# Patient Record
Sex: Female | Born: 1935 | Race: Black or African American | Hispanic: No | Marital: Single
Health system: Southern US, Community
[De-identification: ages and names within clinical notes are randomized; demographics above are authoritative.]

## PROBLEM LIST (undated history)

## (undated) ENCOUNTER — Emergency Department (HOSPITAL_COMMUNITY): Payer: Self-pay | Source: Home / Self Care

## (undated) DIAGNOSIS — I469 Cardiac arrest, cause unspecified: Secondary | ICD-10-CM

## (undated) DIAGNOSIS — J9621 Acute and chronic respiratory failure with hypoxia: Secondary | ICD-10-CM

## (undated) DIAGNOSIS — G9349 Other encephalopathy: Secondary | ICD-10-CM

## (undated) DIAGNOSIS — E669 Obesity, unspecified: Secondary | ICD-10-CM

## (undated) DIAGNOSIS — J181 Lobar pneumonia, unspecified organism: Secondary | ICD-10-CM

## (undated) DIAGNOSIS — I482 Chronic atrial fibrillation, unspecified: Secondary | ICD-10-CM

## (undated) DIAGNOSIS — K219 Gastro-esophageal reflux disease without esophagitis: Secondary | ICD-10-CM

## (undated) DIAGNOSIS — E1169 Type 2 diabetes mellitus with other specified complication: Secondary | ICD-10-CM

## (undated) DIAGNOSIS — F09 Unspecified mental disorder due to known physiological condition: Secondary | ICD-10-CM

## (undated) DIAGNOSIS — R1310 Dysphagia, unspecified: Secondary | ICD-10-CM

## (undated) DIAGNOSIS — A0472 Enterocolitis due to Clostridium difficile, not specified as recurrent: Secondary | ICD-10-CM

## (undated) HISTORY — PX: TRACHEOSTOMY: SUR1362

## (undated) HISTORY — PX: PEG PLACEMENT: SHX5437

---

## 2018-01-14 ENCOUNTER — Inpatient Hospital Stay
Admission: RE | Admit: 2018-01-14 | Discharge: 2018-02-15 | Disposition: E | Payer: 59 | Source: Other Acute Inpatient Hospital | Attending: Internal Medicine | Admitting: Internal Medicine

## 2018-01-14 ENCOUNTER — Other Ambulatory Visit (HOSPITAL_COMMUNITY): Payer: 59

## 2018-01-14 DIAGNOSIS — J9690 Respiratory failure, unspecified, unspecified whether with hypoxia or hypercapnia: Secondary | ICD-10-CM

## 2018-01-14 DIAGNOSIS — F09 Unspecified mental disorder due to known physiological condition: Secondary | ICD-10-CM

## 2018-01-14 DIAGNOSIS — R52 Pain, unspecified: Secondary | ICD-10-CM

## 2018-01-14 DIAGNOSIS — I482 Chronic atrial fibrillation, unspecified: Secondary | ICD-10-CM

## 2018-01-14 DIAGNOSIS — J9621 Acute and chronic respiratory failure with hypoxia: Secondary | ICD-10-CM

## 2018-01-14 DIAGNOSIS — I469 Cardiac arrest, cause unspecified: Secondary | ICD-10-CM

## 2018-01-14 DIAGNOSIS — I509 Heart failure, unspecified: Secondary | ICD-10-CM

## 2018-01-14 DIAGNOSIS — J9600 Acute respiratory failure, unspecified whether with hypoxia or hypercapnia: Secondary | ICD-10-CM

## 2018-01-14 DIAGNOSIS — Z931 Gastrostomy status: Secondary | ICD-10-CM

## 2018-01-14 DIAGNOSIS — J181 Lobar pneumonia, unspecified organism: Secondary | ICD-10-CM

## 2018-01-14 DIAGNOSIS — G9349 Other encephalopathy: Secondary | ICD-10-CM

## 2018-01-14 HISTORY — DX: Other encephalopathy: G93.49

## 2018-01-14 HISTORY — DX: Acute and chronic respiratory failure with hypoxia: J96.21

## 2018-01-14 HISTORY — DX: Dysphagia, unspecified: R13.10

## 2018-01-14 HISTORY — DX: Enterocolitis due to Clostridium difficile, not specified as recurrent: A04.72

## 2018-01-14 HISTORY — DX: Type 2 diabetes mellitus with other specified complication: E11.69

## 2018-01-14 HISTORY — DX: Unspecified mental disorder due to known physiological condition: F09

## 2018-01-14 HISTORY — DX: Cardiac arrest, cause unspecified: I46.9

## 2018-01-14 HISTORY — DX: Lobar pneumonia, unspecified organism: J18.1

## 2018-01-14 HISTORY — DX: Gastro-esophageal reflux disease without esophagitis: K21.9

## 2018-01-14 HISTORY — DX: Type 2 diabetes mellitus with other specified complication: E66.9

## 2018-01-14 HISTORY — DX: Chronic atrial fibrillation, unspecified: I48.20

## 2018-01-14 LAB — BLOOD GAS, ARTERIAL
ACID-BASE EXCESS: 6.3 mmol/L — AB (ref 0.0–2.0)
BICARBONATE: 30.5 mmol/L — AB (ref 20.0–28.0)
FIO2: 28
LHR: 14 {breaths}/min
MECHVT: 400 mL
O2 SAT: 97.5 %
PATIENT TEMPERATURE: 98.6
PCO2 ART: 45.4 mmHg (ref 32.0–48.0)
PEEP/CPAP: 5 cmH2O
PH ART: 7.442 (ref 7.350–7.450)
PO2 ART: 99.2 mmHg (ref 83.0–108.0)

## 2018-01-14 MED ORDER — IOPAMIDOL (ISOVUE-300) INJECTION 61%
INTRAVENOUS | Status: AC
  Administered 2018-01-14: 23:00:00
  Filled 2018-01-14: qty 50

## 2018-01-15 ENCOUNTER — Encounter: Payer: Self-pay | Admitting: Internal Medicine

## 2018-01-15 DIAGNOSIS — J181 Lobar pneumonia, unspecified organism: Secondary | ICD-10-CM | POA: Diagnosis not present

## 2018-01-15 DIAGNOSIS — G9349 Other encephalopathy: Secondary | ICD-10-CM

## 2018-01-15 DIAGNOSIS — J9621 Acute and chronic respiratory failure with hypoxia: Secondary | ICD-10-CM

## 2018-01-15 DIAGNOSIS — I469 Cardiac arrest, cause unspecified: Secondary | ICD-10-CM | POA: Diagnosis not present

## 2018-01-15 DIAGNOSIS — F09 Unspecified mental disorder due to known physiological condition: Secondary | ICD-10-CM

## 2018-01-15 DIAGNOSIS — I482 Chronic atrial fibrillation, unspecified: Secondary | ICD-10-CM

## 2018-01-15 DIAGNOSIS — A0472 Enterocolitis due to Clostridium difficile, not specified as recurrent: Secondary | ICD-10-CM | POA: Insufficient documentation

## 2018-01-15 DIAGNOSIS — R1310 Dysphagia, unspecified: Secondary | ICD-10-CM

## 2018-01-15 HISTORY — DX: Other encephalopathy: G93.49

## 2018-01-15 HISTORY — DX: Lobar pneumonia, unspecified organism: J18.1

## 2018-01-15 HISTORY — DX: Dysphagia, unspecified: R13.10

## 2018-01-15 HISTORY — DX: Unspecified mental disorder due to known physiological condition: F09

## 2018-01-15 HISTORY — DX: Chronic atrial fibrillation, unspecified: I48.20

## 2018-01-15 HISTORY — DX: Cardiac arrest, cause unspecified: I46.9

## 2018-01-15 HISTORY — DX: Enterocolitis due to Clostridium difficile, not specified as recurrent: A04.72

## 2018-01-15 HISTORY — DX: Acute and chronic respiratory failure with hypoxia: J96.21

## 2018-01-15 LAB — CBC WITH DIFFERENTIAL/PLATELET
Basophils Absolute: 0 10*3/uL (ref 0.0–0.1)
Basophils Relative: 0 %
EOS PCT: 3 %
Eosinophils Absolute: 0.2 10*3/uL (ref 0.0–0.7)
HCT: 32.3 % — ABNORMAL LOW (ref 36.0–46.0)
Hemoglobin: 9.5 g/dL — ABNORMAL LOW (ref 12.0–15.0)
LYMPHS ABS: 1.5 10*3/uL (ref 0.7–4.0)
LYMPHS PCT: 19 %
MCH: 30.1 pg (ref 26.0–34.0)
MCHC: 29.4 g/dL — ABNORMAL LOW (ref 30.0–36.0)
MCV: 102.2 fL — AB (ref 78.0–100.0)
MONOS PCT: 8 %
Monocytes Absolute: 0.6 10*3/uL (ref 0.1–1.0)
Neutro Abs: 5.3 10*3/uL (ref 1.7–7.7)
Neutrophils Relative %: 70 %
PLATELETS: 302 10*3/uL (ref 150–400)
RBC: 3.16 MIL/uL — AB (ref 3.87–5.11)
RDW: 16.2 % — ABNORMAL HIGH (ref 11.5–15.5)
WBC: 7.6 10*3/uL (ref 4.0–10.5)

## 2018-01-15 LAB — COMPREHENSIVE METABOLIC PANEL
ALK PHOS: 60 U/L (ref 38–126)
ALT: 13 U/L — AB (ref 14–54)
AST: 28 U/L (ref 15–41)
Albumin: 2 g/dL — ABNORMAL LOW (ref 3.5–5.0)
Anion gap: 11 (ref 5–15)
BUN: 12 mg/dL (ref 6–20)
CALCIUM: 9.6 mg/dL (ref 8.9–10.3)
CHLORIDE: 109 mmol/L (ref 101–111)
CO2: 30 mmol/L (ref 22–32)
CREATININE: 0.58 mg/dL (ref 0.44–1.00)
Glucose, Bld: 148 mg/dL — ABNORMAL HIGH (ref 65–99)
Potassium: 3 mmol/L — ABNORMAL LOW (ref 3.5–5.1)
Sodium: 150 mmol/L — ABNORMAL HIGH (ref 135–145)
Total Bilirubin: 0.4 mg/dL (ref 0.3–1.2)
Total Protein: 6.6 g/dL (ref 6.5–8.1)

## 2018-01-15 LAB — C DIFFICILE QUICK SCREEN W PCR REFLEX
C Diff antigen: NEGATIVE
C Diff interpretation: NOT DETECTED
C Diff toxin: NEGATIVE

## 2018-01-15 LAB — PROTIME-INR
INR: 1.18
Prothrombin Time: 14.9 seconds (ref 11.4–15.2)

## 2018-01-15 LAB — MAGNESIUM: MAGNESIUM: 2.1 mg/dL (ref 1.7–2.4)

## 2018-01-15 LAB — APTT: aPTT: 28 seconds (ref 24–36)

## 2018-01-15 LAB — PHOSPHORUS: PHOSPHORUS: 3.4 mg/dL (ref 2.5–4.6)

## 2018-01-15 NOTE — Consult Note (Addendum)
Pulmonary Critical Care Medicine [facility] [place]   PULMONARY SERVICE  CONSULT NOTE  Date of Service: 01/15/2018  Ariana Warren  UEA:[id number]  DOB: 1933/05/21   DOA: 02-04-2018  Referring Physician: Carron Curie, MD  HPI: Ariana Warren is a 84 y.o. female seen for follow up of Acute on Chronic Respiratory Failure.  Patient has multiple medical problems including atrial fibrillation GERD diabetes type 2 cognitive dysfunction presented to the hospital from her nursing home after suffering a cardiac arrest.  Patient was found at the time unresponsive and EMS was called and CPR started.  Patient was given several rounds of drugs and was intubated.  Patient did have return of circulation at the skilled nursing facility apparently and she was transferred.  CT angiogram revealed no acute bleed.  The echocardiogram revealed an ejection fraction of 35% with severe apical hypokinesis.  She also did have a CT scan of the chest done which was unremarkable.  Follow-up echo revealed the ejection fraction to be improved.  Other complications included development of rapid atrial fibrillation.  She was given a trial of extubation however failed and had to be reintubated.  Its noted that the patient had suffered global encephalopathy and neurology did see the patient at that time.  Apparently family wanted aggressive care measures so the patient has continued to stay on the ventilator.  She underwent a tracheostomy as well as a PEG  Medications: Reviewed on Rounds  Allergies clonazepam aspirin  Past Medical History:  Diagnosis Date  . Acute on chronic respiratory failure with hypoxia (HCC) 01/15/2018  . Atrial fibrillation, chronic (HCC) 01/15/2018  . C. difficile colitis 01/15/2018  . Cardiac arrest (HCC) 01/15/2018  . Cognitive dysfunction 01/15/2018  . Diabetes mellitus type 2 in obese (HCC)   . Dysphagia 01/15/2018  . Encephalopathy chronic 01/15/2018  . GERD (gastroesophageal reflux disease)   .  Lobar pneumonia (HCC) 01/15/2018   Past Surgical History:  Procedure Laterality Date  . PEG PLACEMENT    . TRACHEOSTOMY     History reviewed. No pertinent family history. Social History   Socioeconomic History  . Marital status: Single    Spouse name: Not on file  . Number of children: Not on file  . Years of education: Not on file  . Highest education level: Not on file  Occupational History  . Not on file  Social Needs  . Financial resource strain: Not on file  . Food insecurity:    Worry: Not on file    Inability: Not on file  . Transportation needs:    Medical: Not on file    Non-medical: Not on file  Tobacco Use  . Smoking status: Unknown If Ever Smoked  . Smokeless tobacco: Never Used  Substance and Sexual Activity  . Alcohol use: Not Currently  . Drug use: Not Currently  . Sexual activity: Not Currently  Lifestyle  . Physical activity:    Days per week: Not on file    Minutes per session: Not on file  . Stress: Not on file  Relationships  . Social connections:    Talks on phone: Not on file    Gets together: Not on file    Attends religious service: Not on file    Active member of club or organization: Not on file    Attends meetings of clubs or organizations: Not on file    Relationship status: Not on file  . Intimate partner violence:    Fear of current or ex  partner: Not on file    Emotionally abused: Not on file    Physically abused: Not on file    Forced sexual activity: Not on file  Other Topics Concern  . Not on file  Social History Narrative  . Not on file    Physical Exam:  Vitals: Temperature 97.1 pulse 103 respiratory rate 30 blood pressure 131/79 saturations 100%  Ventilator Settings mode of ventilation assist control FiO2 28% tidal volume 455 PEEP 5  . General: Comfortable at this time . Eyes: Grossly normal lids, irises & conjunctiva . ENT: grossly tongue is normal . Neck: no obvious mass . Cardiovascular: S1-S2 normal no gallop or  rub . Respiratory: Scattered distant rhonchi are noted . Abdomen: Obese and soft . Skin: no rash seen on limited exam . Musculoskeletal: not rigid . Psychiatric:unable to assess . Neurologic: no seizure no involuntary movements         Labs on Admission:  Basic Metabolic Panel: Recent Labs  Lab 01/15/18 0448  NA 150*  K 3.0*  CL 109  CO2 30  GLUCOSE 148*  BUN 12  CREATININE 0.58  CALCIUM 9.6  MG 2.1  PHOS 3.4    Liver Function Tests: Recent Labs  Lab 01/15/18 0448  AST 28  ALT 13*  ALKPHOS 60  BILITOT 0.4  PROT 6.6  ALBUMIN 2.0*   No results for input(s): LIPASE, AMYLASE in the last 168 hours. No results for input(s): AMMONIA in the last 168 hours.  CBC: Recent Labs  Lab 01/15/18 0448  WBC 7.6  NEUTROABS 5.3  HGB 9.5*  HCT 32.3*  MCV 102.2*  PLT 302    Cardiac Enzymes: No results for input(s): CKTOTAL, CKMB, CKMBINDEX, TROPONINI in the last 168 hours.  BNP (last 3 results) No results for input(s): BNP in the last 8760 hours.  ProBNP (last 3 results) No results for input(s): PROBNP in the last 8760 hours.  Radiological Exams on Admission: Dg Abd Portable 1v  Result Date: 01/22/18 CLINICAL DATA:  Gastrostomy tube position check. 40 mL of Isovue administered. EXAM: PORTABLE ABDOMEN - 1 VIEW COMPARISON:  None. FINDINGS: There is contrast material within the stomach. No dilated small bowel. IMPRESSION: Contrast injection confirming gastrostomy tube tip within the gastric lumen. Electronically Signed   By: Deatra Robinson M.D.   On: 2018-01-22 22:48    Assessment/Plan Principal Problem:   Acute on chronic respiratory failure with hypoxia (HCC) Active Problems:   Cardiac arrest (HCC)   Dysphagia   Cognitive dysfunction   Atrial fibrillation, chronic (HCC)   Lobar pneumonia (HCC)   C. difficile colitis   Encephalopathy chronic   1. Acute on chronic respiratory failure with hypoxia patient has not really had much improvement as far as being able  to wean currently she is on assist control mode.  We will have respiratory therapy assess the our SBI in the mechanics and try to start the weaning protocol.  She will continue with supportive care aggressive pulmonary toilet. 2. Encephalopathy she is not really had much in the way of improvement we need to continue to monitor if there is going to be any improvement in her cognitive function. 3. Chronic atrial fibrillation rate is controlled at this time we will continue with supportive care monitor rate and medications. 4. Lobar pneumonia she was treated with antibiotics we will continue to follow her x-rays as deemed necessary. 5. Status post cardiac arrest the initial echo had shown an depressed ejection fraction follow-up echo showed improved ejection  fraction we will continue to follow closely obviously she has not had much in the way of waking up so her prognosis neurologically may be rather poor   I have personally seen and evaluated the patient, evaluated laboratory and imaging results, formulated the assessment and plan and placed orders. The Patient requires high complexity decision making for assessment and support.  Case was discussed on Rounds with the Respiratory Therapy Staff time spent 70 minutes  Yevonne Pax, MD [facility] Pulmonary Critical Care Medicine Sleep Medicine

## 2018-01-15 DEATH — deceased

## 2018-01-16 DIAGNOSIS — J9621 Acute and chronic respiratory failure with hypoxia: Secondary | ICD-10-CM | POA: Diagnosis not present

## 2018-01-16 DIAGNOSIS — I482 Chronic atrial fibrillation: Secondary | ICD-10-CM

## 2018-01-16 DIAGNOSIS — J181 Lobar pneumonia, unspecified organism: Secondary | ICD-10-CM

## 2018-01-16 DIAGNOSIS — I469 Cardiac arrest, cause unspecified: Secondary | ICD-10-CM

## 2018-01-16 DIAGNOSIS — G9349 Other encephalopathy: Secondary | ICD-10-CM

## 2018-01-16 DIAGNOSIS — F09 Unspecified mental disorder due to known physiological condition: Secondary | ICD-10-CM

## 2018-01-16 LAB — BASIC METABOLIC PANEL
ANION GAP: 9 (ref 5–15)
BUN: 12 mg/dL (ref 6–20)
CHLORIDE: 105 mmol/L (ref 101–111)
CO2: 33 mmol/L — AB (ref 22–32)
Calcium: 9.4 mg/dL (ref 8.9–10.3)
Creatinine, Ser: 0.58 mg/dL (ref 0.44–1.00)
GFR calc non Af Amer: >60 mL/min (ref >60)
GLUCOSE: 162 mg/dL — AB (ref 65–99)
POTASSIUM: 3.5 mmol/L (ref 3.5–5.1)
Sodium: 147 mmol/L — ABNORMAL HIGH (ref 135–145)

## 2018-01-16 NOTE — Progress Notes (Signed)
Pulmonary Critical Care Medicine [facility] [place]   PULMONARY SERVICE  PROGRESS NOTE  Date of Service: 01/16/2018  Ariana Warren  QMV:[id number]  DOB: 1932-10-15   DOA: 26-Jan-2018  Referring Physician: Carron Curie, MD  HPI: Ariana Warren is a 84 y.o. female seen for follow up of Acute on Chronic Respiratory Failure.  Patient right now remains on the ventilator currently is on 28% oxygen.  Is on assist control mode with a PEEP of 5 at this time.  Prior sputum mechanics are being evaluated and trying to wean the patient if possible.  Medications: Reviewed on Rounds  Physical Exam:  Vitals: Temperature 97.6 pulse 65 respiratory rate 23 blood pressure 138/80 saturations 100%  Ventilator Settings mode of ventilation assist control FiO2 20% tidal volume 417 PEEP 5  . General: Comfortable at this time . Eyes: Grossly normal lids, irises & conjunctiva . ENT: grossly tongue is normal . Neck: no obvious mass . Cardiovascular: S1-S2 normal no gallop . Respiratory: Scattered rhonchi . Abdomen: Obese and soft . Skin: no rash seen on limited exam . Musculoskeletal: not rigid . Psychiatric:unable to assess . Neurologic: no seizure no involuntary movements         Labs on Admission:  Basic Metabolic Panel: Recent Labs  Lab 01/15/18 0448 01/16/18 0701  NA 150* 147*  K 3.0* 3.5  CL 109 105  CO2 30 33*  GLUCOSE 148* 162*  BUN 12 12  CREATININE 0.58 0.58  CALCIUM 9.6 9.4  MG 2.1  --   PHOS 3.4  --     Liver Function Tests: Recent Labs  Lab 01/15/18 0448  AST 28  ALT 13*  ALKPHOS 60  BILITOT 0.4  PROT 6.6  ALBUMIN 2.0*   No results for input(s): LIPASE, AMYLASE in the last 168 hours. No results for input(s): AMMONIA in the last 168 hours.  CBC: Recent Labs  Lab 01/15/18 0448  WBC 7.6  NEUTROABS 5.3  HGB 9.5*  HCT 32.3*  MCV 102.2*  PLT 302    Cardiac Enzymes: No results for input(s): CKTOTAL, CKMB, CKMBINDEX, TROPONINI in the last 168  hours.  BNP (last 3 results) No results for input(s): BNP in the last 8760 hours.  ProBNP (last 3 results) No results for input(s): PROBNP in the last 8760 hours.  Radiological Exams on Admission: Dg Abd Portable 1v  Result Date: 2018/01/26 CLINICAL DATA:  Gastrostomy tube position check. 40 mL of Isovue administered. EXAM: PORTABLE ABDOMEN - 1 VIEW COMPARISON:  None. FINDINGS: There is contrast material within the stomach. No dilated small bowel. IMPRESSION: Contrast injection confirming gastrostomy tube tip within the gastric lumen. Electronically Signed   By: Deatra Robinson M.D.   On: 01-26-18 22:48    Assessment/Plan Principal Problem:   Acute on chronic respiratory failure with hypoxia (HCC) Active Problems:   Cardiac arrest (HCC)   Dysphagia   Cognitive dysfunction   Atrial fibrillation, chronic (HCC)   Lobar pneumonia (HCC)   C. difficile colitis   Encephalopathy chronic   1. Acute on chronic respiratory failure with hypoxia at this time patient is not weaning RSB I was checked and reportedly not good.  We will have the respiratory therapist reassess and try to resume pressure support wean as tolerated. 2. Chronic atrial fibrillation rate is controlled at this time we will continue to monitor. 3. Cognitive dysfunction we will continue with supportive care 4. Chronic congestive heart failure patient had low ejection fraction noted previously we will continue to follow and  also need to follow her fluid status closely.  Prevent fluid overload 5. Lobar pneumonia treated with antibiotics we will continue with supportive care 6. Chronic encephalopathy she remains grossly unchanged we will continue to follow   I have personally seen and evaluated the patient, evaluated laboratory and imaging results, formulated the assessment and plan and placed orders. The Patient requires high complexity decision making for assessment and support.  Case was discussed on Rounds with the  Respiratory Therapy Staff time spent 35 minutes  Yevonne Pax, MD [facility] Pulmonary Critical Care Medicine Sleep Medicine

## 2018-01-17 ENCOUNTER — Encounter (HOSPITAL_BASED_OUTPATIENT_CLINIC_OR_DEPARTMENT_OTHER): Payer: 59

## 2018-01-17 DIAGNOSIS — I469 Cardiac arrest, cause unspecified: Secondary | ICD-10-CM | POA: Diagnosis not present

## 2018-01-17 DIAGNOSIS — I482 Chronic atrial fibrillation: Secondary | ICD-10-CM | POA: Diagnosis not present

## 2018-01-17 DIAGNOSIS — J9621 Acute and chronic respiratory failure with hypoxia: Secondary | ICD-10-CM | POA: Diagnosis not present

## 2018-01-17 DIAGNOSIS — M7989 Other specified soft tissue disorders: Secondary | ICD-10-CM

## 2018-01-17 DIAGNOSIS — F09 Unspecified mental disorder due to known physiological condition: Secondary | ICD-10-CM | POA: Diagnosis not present

## 2018-01-17 NOTE — Progress Notes (Signed)
Right upper extremity venous duplex has been completed. Negative for DVT.  01/17/18 12:02 PM Olen Cordial RVT

## 2018-01-17 NOTE — Progress Notes (Signed)
Pulmonary Critical Care Medicine [facility] [place]   PULMONARY SERVICE  PROGRESS NOTE  Date of Service: 01/17/2018  Mitzy Naron  ZOX:[id number]  DOB: 1933/09/05   DOA: 2018/02/03  Referring Physician: Carron Curie, MD  HPI: Rettie Laird is a 84 y.o. female seen for follow up of Acute on Chronic Respiratory Failure.  Right now is on pressure support with a goal of about 8 hours.  Good saturations are noted.  Medications: Reviewed on Rounds  Physical Exam:  Vitals: Temperature 97.6 pulse 54 respiratory 29 blood pressure 119/64 saturations 100%  Ventilator Settings mode of ventilation pressure support FiO2 20% tidal volume 301 pressure support 12 PEEP 5  . General: Comfortable at this time . Eyes: Grossly normal lids, irises & conjunctiva . ENT: grossly tongue is normal . Neck: no obvious mass . Cardiovascular: S1-S2 normal no gallop or rub . Respiratory: No rhonchi noted bilaterally . Abdomen: Obese and soft . Skin: no rash seen on limited exam . Musculoskeletal: not rigid . Psychiatric:unable to assess . Neurologic: no seizure no involuntary movements         Labs on Admission:  Basic Metabolic Panel: Recent Labs  Lab 01/15/18 0448 01/16/18 0701  NA 150* 147*  K 3.0* 3.5  CL 109 105  CO2 30 33*  GLUCOSE 148* 162*  BUN 12 12  CREATININE 0.58 0.58  CALCIUM 9.6 9.4  MG 2.1  --   PHOS 3.4  --     Liver Function Tests: Recent Labs  Lab 01/15/18 0448  AST 28  ALT 13*  ALKPHOS 60  BILITOT 0.4  PROT 6.6  ALBUMIN 2.0*   No results for input(s): LIPASE, AMYLASE in the last 168 hours. No results for input(s): AMMONIA in the last 168 hours.  CBC: Recent Labs  Lab 01/15/18 0448  WBC 7.6  NEUTROABS 5.3  HGB 9.5*  HCT 32.3*  MCV 102.2*  PLT 302    Cardiac Enzymes: No results for input(s): CKTOTAL, CKMB, CKMBINDEX, TROPONINI in the last 168 hours.  BNP (last 3 results) No results for input(s): BNP in the last 8760 hours.  ProBNP  (last 3 results) No results for input(s): PROBNP in the last 8760 hours.  Radiological Exams on Admission: Dg Abd Portable 1v  Result Date: 03-Feb-2018 CLINICAL DATA:  Gastrostomy tube position check. 40 mL of Isovue administered. EXAM: PORTABLE ABDOMEN - 1 VIEW COMPARISON:  None. FINDINGS: There is contrast material within the stomach. No dilated small bowel. IMPRESSION: Contrast injection confirming gastrostomy tube tip within the gastric lumen. Electronically Signed   By: Deatra Robinson M.D.   On: 2018-02-03 22:48    Assessment/Plan Principal Problem:   Acute on chronic respiratory failure with hypoxia (HCC) Active Problems:   Cardiac arrest (HCC)   Dysphagia   Cognitive dysfunction   Atrial fibrillation, chronic (HCC)   Lobar pneumonia (HCC)   C. difficile colitis   Encephalopathy chronic   1. Acute on chronic respiratory failure with hypoxia continue to wean the goal for today is about 8 hours on pressure support mode good saturations are noted. 2. Status post cardiac arrest she is at baseline we will continue with supportive care 3. Cognitive dysfunction slow to improve we will continue to follow 4. Chronic atrial fibrillation rate is controlled 5. Lobar pneumonia treated with antibiotics we will continue with supportive care 6. Encephalopathy as above   I have personally seen and evaluated the patient, evaluated laboratory and imaging results, formulated the assessment and plan and placed orders.  The Patient requires high complexity decision making for assessment and support.  Case was discussed on Rounds with the Respiratory Therapy Staff  Allyne Gee, MD [facility] Pulmonary Critical Care Medicine Sleep Medicine

## 2018-01-19 DIAGNOSIS — I482 Chronic atrial fibrillation: Secondary | ICD-10-CM | POA: Diagnosis not present

## 2018-01-19 DIAGNOSIS — J9621 Acute and chronic respiratory failure with hypoxia: Secondary | ICD-10-CM | POA: Diagnosis not present

## 2018-01-19 DIAGNOSIS — F09 Unspecified mental disorder due to known physiological condition: Secondary | ICD-10-CM | POA: Diagnosis not present

## 2018-01-19 DIAGNOSIS — G9349 Other encephalopathy: Secondary | ICD-10-CM | POA: Diagnosis not present

## 2018-01-19 NOTE — Progress Notes (Signed)
Pulmonary Critical Care Medicine [facility] [place]   PULMONARY SERVICE  PROGRESS NOTE  Date of Service: 01/19/2018  Ariana Warren  ZOX:[id number]  DOB: 1933-02-23   DOA: Jan 18, 2018  Referring Physician: Carron Curie, MD  HPI: Ariana Warren is a 84 y.o. female seen for follow up of Acute on Chronic Respiratory Failure.  Right now patient is on pressure support weaning has been doing fairly well going to advance to wean to tolerated  Medications: Reviewed on Rounds  Physical Exam:  Vitals: Temperature 97.6 pulse 77 respiratory rate 22 blood pressure 161/96 saturations 100%  Ventilator Settings mode of ventilation pressure support FiO2 28% tidal volume 446 pressure support 12 PEEP 5  . General: Comfortable at this time . Eyes: Grossly normal lids, irises & conjunctiva . ENT: grossly tongue is normal . Neck: no obvious mass . Cardiovascular: S1-S2 normal no gallop or rub . Respiratory: No rhonchi are noted . Abdomen: Soft nontender . Skin: no rash seen on limited exam . Musculoskeletal: not rigid . Psychiatric:unable to assess . Neurologic: no seizure no involuntary movements         Labs on Admission:  Basic Metabolic Panel: Recent Labs  Lab 01/15/18 0448 01/16/18 0701  NA 150* 147*  K 3.0* 3.5  CL 109 105  CO2 30 33*  GLUCOSE 148* 162*  BUN 12 12  CREATININE 0.58 0.58  CALCIUM 9.6 9.4  MG 2.1  --   PHOS 3.4  --     Liver Function Tests: Recent Labs  Lab 01/15/18 0448  AST 28  ALT 13*  ALKPHOS 60  BILITOT 0.4  PROT 6.6  ALBUMIN 2.0*   No results for input(s): LIPASE, AMYLASE in the last 168 hours. No results for input(s): AMMONIA in the last 168 hours.  CBC: Recent Labs  Lab 01/15/18 0448  WBC 7.6  NEUTROABS 5.3  HGB 9.5*  HCT 32.3*  MCV 102.2*  PLT 302    Cardiac Enzymes: No results for input(s): CKTOTAL, CKMB, CKMBINDEX, TROPONINI in the last 168 hours.  BNP (last 3 results) No results for input(s): BNP in the last 8760  hours.  ProBNP (last 3 results) No results for input(s): PROBNP in the last 8760 hours.  Radiological Exams on Admission: No results found.  Assessment/Plan Principal Problem:   Acute on chronic respiratory failure with hypoxia (HCC) Active Problems:   Cardiac arrest (HCC)   Dysphagia   Cognitive dysfunction   Atrial fibrillation, chronic (HCC)   Lobar pneumonia (HCC)   C. difficile colitis   Encephalopathy chronic   1. Acute on chronic respiratory failure with hypoxia we will continue weaning on pressure support so far she is tolerating it well.  We will continue pulmonary toilet and secretion management 2. Cognitive dysfunction clinically stabilized 3. Chronic atrial fibrillation rate is controlled 4. Lobar pneumonia treated with antibiotics 5. Encephalopathy at baseline   I have personally seen and evaluated the patient, evaluated laboratory and imaging results, formulated the assessment and plan and placed orders. The Patient requires high complexity decision making for assessment and support.  Case was discussed on Rounds with the Respiratory Therapy Staff  Yevonne Pax, MD [facility] Pulmonary Critical Care Medicine Sleep Medicine

## 2018-01-20 ENCOUNTER — Other Ambulatory Visit (HOSPITAL_COMMUNITY): Payer: 59

## 2018-01-20 DIAGNOSIS — I482 Chronic atrial fibrillation: Secondary | ICD-10-CM | POA: Diagnosis not present

## 2018-01-20 DIAGNOSIS — I469 Cardiac arrest, cause unspecified: Secondary | ICD-10-CM | POA: Diagnosis not present

## 2018-01-20 DIAGNOSIS — F09 Unspecified mental disorder due to known physiological condition: Secondary | ICD-10-CM | POA: Diagnosis not present

## 2018-01-20 DIAGNOSIS — J9621 Acute and chronic respiratory failure with hypoxia: Secondary | ICD-10-CM | POA: Diagnosis not present

## 2018-01-20 LAB — BASIC METABOLIC PANEL
Anion gap: 10 (ref 5–15)
BUN: 6 mg/dL (ref 6–20)
CO2: 34 mmol/L — ABNORMAL HIGH (ref 22–32)
Calcium: 9.3 mg/dL (ref 8.9–10.3)
Chloride: 98 mmol/L — ABNORMAL LOW (ref 101–111)
Creatinine, Ser: 0.58 mg/dL (ref 0.44–1.00)
GFR calc Af Amer: >60 mL/min (ref >60)
GFR calc non Af Amer: >60 mL/min (ref >60)
Glucose, Bld: 117 mg/dL — ABNORMAL HIGH (ref 65–99)
POTASSIUM: 2.4 mmol/L — AB (ref 3.5–5.1)
SODIUM: 142 mmol/L (ref 135–145)

## 2018-01-20 LAB — CBC
HCT: 31.4 % — ABNORMAL LOW (ref 36.0–46.0)
Hemoglobin: 9.6 g/dL — ABNORMAL LOW (ref 12.0–15.0)
MCH: 30.4 pg (ref 26.0–34.0)
MCHC: 30.6 g/dL (ref 30.0–36.0)
MCV: 99.4 fL (ref 78.0–100.0)
PLATELETS: 261 10*3/uL (ref 150–400)
RBC: 3.16 MIL/uL — AB (ref 3.87–5.11)
RDW: 14.9 % (ref 11.5–15.5)
WBC: 9.2 10*3/uL (ref 4.0–10.5)

## 2018-01-20 NOTE — Progress Notes (Signed)
Pulmonary Critical Care Medicine [facility] [place]   PULMONARY SERVICE  PROGRESS NOTE  Date of Service: 01/20/2018  Ariana Warren  WUX:[id number]  DOB: [birth year]   DOA: 24-Jan-2018  Referring Physician: Carron Curie, MD  HPI: Ariana Warren is a 84 y.o. female seen for follow up of Acute on Chronic Respiratory Failure.  Currently weaning on pressure support has been on 20% oxygen  Medications: Reviewed on Rounds  Physical Exam:  Vitals: Temperature 97.2 pulse 84 respiratory 18 blood pressure 121/65 saturations 100%  Ventilator Settings of ventilation pressure support FiO2 28% tidal volume 447 pressure support 12/5  . General: Comfortable at this time . Eyes: Grossly normal lids, irises & conjunctiva . ENT: grossly tongue is normal . Neck: no obvious mass . Cardiovascular: S1-S2 normal no gallop or rub . Respiratory: No rhonchi expansion equal . Abdomen: Soft nontender . Skin: no rash seen on limited exam . Musculoskeletal: not rigid . Psychiatric:unable to assess . Neurologic: no seizure no involuntary movements         Labs on Admission:  Basic Metabolic Panel: Recent Labs  Lab 01/15/18 0448 01/16/18 0701 01/20/18 0502  NA 150* 147* 142  K 3.0* 3.5 2.4*  CL 109 105 98*  CO2 30 33* 34*  GLUCOSE 148* 162* 117*  BUN CREATININE 0.58 0.58 0.58  CALCIUM 9.6 9.4 9.3  MG 2.1  --   --   PHOS 3.4  --   --     Liver Function Tests: Recent Labs  Lab 01/15/18 0448  AST 28  ALT 13*  ALKPHOS 60  BILITOT 0.4  PROT 6.6  ALBUMIN 2.0*   No results for input(s): LIPASE, AMYLASE in the last 168 hours. No results for input(s): AMMONIA in the last 168 hours.  CBC: Recent Labs  Lab 01/15/18 0448 01/20/18 0502  WBC 7.6 9.2  NEUTROABS 5.3  --   HGB 9.5* 9.6*  HCT 32.3* 31.4*  MCV 102.2* 99.4  PLT 302 261    Cardiac Enzymes: No results for input(s): CKTOTAL, CKMB, CKMBINDEX, TROPONINI in the last 168 hours.  BNP (last 3 results) No  results for input(s): BNP in the last 8760 hours.  ProBNP (last 3 results) No results for input(s): PROBNP in the last 8760 hours.  Radiological Exams on Admission: Dg Chest Port 1 View  Result Date: 01/20/2018 CLINICAL DATA:  Shortness of breath EXAM: PORTABLE CHEST 1 VIEW COMPARISON:  None. FINDINGS: A left-sided PICC line terminates in the SVC just below the brachiocephalic confluence. No pneumothorax identified. However, the apices are partially obscured by the patient's overlapping chin. Haziness over the bases is most consistent with effusions with underlying opacities, possibly atelectasis. The right hilum is mildly prominent, possibly due to technique. The cardiomediastinal silhouette is otherwise normal. IMPRESSION: 1. Mild prominence of the right hilum is favored to be technical in nature. Recommend a PA and lateral chest x-ray before discharge. 2. Small layering effusions with underlying opacities. 3. Left PICC line as above. Electronically Signed   By: Gerome Sam III M.D   On: 01/20/2018 09:29    Assessment/Plan Principal Problem:   Acute on chronic respiratory failure with hypoxia [facility]) Active Problems:   Cardiac arrest [facility])   Dysphagia   Cognitive dysfunction   Atrial fibrillation, chronic (HCC)   Lobar pneumonia (HCC)   C. difficile colitis   Encephalopathy chronic   1. Acute on chronic respiratory failure with hypoxia continue with weaning will advance to T collar trials as  tolerated. 2. Cognitive dysfunction patient is at baseline we will continue with supportive care 3. Lobar pneumonia treated with antibiotics 4. Chronic atrial fibrillation rate is controlled we will follow along 5. Status post cardiac arrest at baseline 6. Chronic encephalopathy unchanged   I have personally seen and evaluated the patient, evaluated laboratory and imaging results, formulated the assessment and plan and placed orders. The Patient requires high complexity decision making for  assessment and support.  Case was discussed on Rounds with the Respiratory Therapy Staff  Yevonne Pax, MD [facility] Pulmonary Critical Care Medicine Sleep Medicine

## 2018-01-21 DIAGNOSIS — I469 Cardiac arrest, cause unspecified: Secondary | ICD-10-CM | POA: Diagnosis not present

## 2018-01-21 DIAGNOSIS — J9621 Acute and chronic respiratory failure with hypoxia: Secondary | ICD-10-CM | POA: Diagnosis not present

## 2018-01-21 DIAGNOSIS — F09 Unspecified mental disorder due to known physiological condition: Secondary | ICD-10-CM | POA: Diagnosis not present

## 2018-01-21 DIAGNOSIS — I482 Chronic atrial fibrillation: Secondary | ICD-10-CM | POA: Diagnosis not present

## 2018-01-21 LAB — POTASSIUM: Potassium: 3.4 mmol/L — ABNORMAL LOW (ref 3.5–5.1)

## 2018-01-21 NOTE — Progress Notes (Signed)
Pulmonary Critical Care Medicine [facility] [place]   PULMONARY SERVICE  PROGRESS NOTE  Date of Service: 01/21/2018  Ariana Warren  ZOX:[id number]  DOB: [birth year]   DOA: 01/02/2018  Referring Physician: Carron Curie, MD  HPI: Ariana Warren is a 84 y.o. female seen for follow up of Acute on Chronic Respiratory Failure.  Currently patient's on T collar has been on 20% oxygen goal is for 24 hours so far seems to be tolerating it well  Medications: Reviewed on Rounds  Physical Exam:  Vitals: Temperature 96.2 pulse 64 respiratory rate 27 blood pressure 178/81 saturations 99%  Ventilator Settings aerosolized T collar FiO2 20%  . General: Comfortable at this time . Eyes: Grossly normal lids, irises & conjunctiva . ENT: grossly tongue is normal . Neck: no obvious mass . Cardiovascular: S1-S2 normal no gallop or rub . Respiratory: No rhonchi expansion is equal . Abdomen: Soft nontender . Skin: no rash seen on limited exam . Musculoskeletal: not rigid . Psychiatric:unable to assess . Neurologic: no seizure no involuntary movements         Labs on Admission:  Basic Metabolic Panel: Recent Labs  Lab 01/15/18 0448 01/16/18 0701 01/20/18 0502 01/21/18 1017  NA 150* 147* 142  --   K 3.0* 3.5 2.4* 3.4*  CL 109 105 98*  --   CO2 30 33* 34*  --   GLUCOSE 148* 162* 117*  --   BUN --   CREATININE 0.58 0.58 0.58  --   CALCIUM 9.6 9.4 9.3  --   MG 2.1  --   --   --   PHOS 3.4  --   --   --     Liver Function Tests: Recent Labs  Lab 01/15/18 0448  AST 28  ALT 13*  ALKPHOS 60  BILITOT 0.4  PROT 6.6  ALBUMIN 2.0*   No results for input(s): LIPASE, AMYLASE in the last 168 hours. No results for input(s): AMMONIA in the last 168 hours.  CBC: Recent Labs  Lab 01/15/18 0448 01/20/18 0502  WBC 7.6 9.2  NEUTROABS 5.3  --   HGB 9.5* 9.6*  HCT 32.3* 31.4*  MCV 102.2* 99.4  PLT 302 261    Cardiac Enzymes: No results for input(s): CKTOTAL, CKMB,  CKMBINDEX, TROPONINI in the last 168 hours.  BNP (last 3 results) No results for input(s): BNP in the last 8760 hours.  ProBNP (last 3 results) No results for input(s): PROBNP in the last 8760 hours.  Radiological Exams on Admission: Dg Chest Port 1 View  Result Date: 01/20/2018 CLINICAL DATA:  Shortness of breath EXAM: PORTABLE CHEST 1 VIEW COMPARISON:  None. FINDINGS: A left-sided PICC line terminates in the SVC just below the brachiocephalic confluence. No pneumothorax identified. However, the apices are partially obscured by the patient's overlapping chin. Haziness over the bases is most consistent with effusions with underlying opacities, possibly atelectasis. The right hilum is mildly prominent, possibly due to technique. The cardiomediastinal silhouette is otherwise normal. IMPRESSION: 1. Mild prominence of the right hilum is favored to be technical in nature. Recommend a PA and lateral chest x-ray before discharge. 2. Small layering effusions with underlying opacities. 3. Left PICC line as above. Electronically Signed   By: Gerome Sam III M.D   On: 01/20/2018 09:29    Assessment/Plan Principal Problem:   Acute on chronic respiratory failure with hypoxia [facility]) Active Problems:   Cardiac arrest [facility])   Dysphagia   Cognitive dysfunction  Atrial fibrillation, chronic (HCC)   Lobar pneumonia (HCC)   C. difficile colitis   Encephalopathy chronic   1. Acute on chronic respiratory failure with hypoxia patient will continue with weaning on T collar as ordered so far looks good hopefully we should be able to start capping trials soon. 2. Chronic atrial fibrillation rate is controlled we will continue with supportive care 3. Lobar pneumonia treated with antibiotics we will continue to follow 4. Chronic encephalopathy patient is at baseline we will continue to monitor 5. Status post cardiac arrest grossly unchanged stable   I have personally seen and evaluated the patient,  evaluated laboratory and imaging results, formulated the assessment and plan and placed orders. The Patient requires high complexity decision making for assessment and support.  Case was discussed on Rounds with the Respiratory Therapy Staff  Yevonne Pax, MD [facility] Pulmonary Critical Care Medicine Sleep Medicine

## 2018-01-22 DIAGNOSIS — I469 Cardiac arrest, cause unspecified: Secondary | ICD-10-CM | POA: Diagnosis not present

## 2018-01-22 DIAGNOSIS — I482 Chronic atrial fibrillation: Secondary | ICD-10-CM | POA: Diagnosis not present

## 2018-01-22 DIAGNOSIS — G9349 Other encephalopathy: Secondary | ICD-10-CM | POA: Diagnosis not present

## 2018-01-22 DIAGNOSIS — J9621 Acute and chronic respiratory failure with hypoxia: Secondary | ICD-10-CM | POA: Diagnosis not present

## 2018-01-22 DIAGNOSIS — F09 Unspecified mental disorder due to known physiological condition: Secondary | ICD-10-CM | POA: Diagnosis not present

## 2018-01-22 DIAGNOSIS — J181 Lobar pneumonia, unspecified organism: Secondary | ICD-10-CM | POA: Diagnosis not present

## 2018-01-22 LAB — POTASSIUM: POTASSIUM: 4.2 mmol/L (ref 3.5–5.1)

## 2018-01-22 NOTE — Progress Notes (Signed)
Pulmonary Critical Care Medicine [facility] [place]   PULMONARY SERVICE  PROGRESS NOTE  Date of Service: 01/22/2018  Ariana Warren  JXB:[id number]  DOB: 1933/04/15   DOA: 12/26/2017  Referring Physician: Carron Curie, MD  HPI: Ariana Warren is a 84 y.o. female seen for follow up of Acute on Chronic Respiratory Failure.  Currently on T collar has been off the ventilator for more than 48 hours now  Medications: Reviewed on Rounds  Physical Exam:  Vitals: Temperature 97.6 pulse 67 respiratory rate 20 blood pressure 120/78 saturation 96%  Ventilator Settings aerosolized T collar FiO2 20%  . General: Comfortable at this time . Eyes: Grossly normal lids, irises & conjunctiva . ENT: grossly tongue is normal . Neck: no obvious mass . Cardiovascular: S1-S2 normal no gallop or rub . Respiratory: Scattered distant rhonchi . Abdomen: Obese and soft . Skin: no rash seen on limited exam . Musculoskeletal: not rigid . Psychiatric:unable to assess . Neurologic: no seizure no involuntary movements         Labs on Admission:  Basic Metabolic Panel: Recent Labs  Lab 01/16/18 0701 01/20/18 0502 01/21/18 1017  NA 147* 142  --   K 3.5 2.4* 3.4*  CL 105 98*  --   CO2 33* 34*  --   GLUCOSE 162* 117*  --   BUN 12 6  --   CREATININE 0.58 0.58  --   CALCIUM 9.4 9.3  --     Liver Function Tests: No results for input(s): AST, ALT, ALKPHOS, BILITOT, PROT, ALBUMIN in the last 168 hours. No results for input(s): LIPASE, AMYLASE in the last 168 hours. No results for input(s): AMMONIA in the last 168 hours.  CBC: Recent Labs  Lab 01/20/18 0502  WBC 9.2  HGB 9.6*  HCT 31.4*  MCV 99.4  PLT 261    Cardiac Enzymes: No results for input(s): CKTOTAL, CKMB, CKMBINDEX, TROPONINI in the last 168 hours.  BNP (last 3 results) No results for input(s): BNP in the last 8760 hours.  ProBNP (last 3 results) No results for input(s): PROBNP in the last 8760 hours.  Radiological  Exams on Admission: Dg Chest Port 1 View  Result Date: 01/20/2018 CLINICAL DATA:  Shortness of breath EXAM: PORTABLE CHEST 1 VIEW COMPARISON:  None. FINDINGS: A left-sided PICC line terminates in the SVC just below the brachiocephalic confluence. No pneumothorax identified. However, the apices are partially obscured by the patient's overlapping chin. Haziness over the bases is most consistent with effusions with underlying opacities, possibly atelectasis. The right hilum is mildly prominent, possibly due to technique. The cardiomediastinal silhouette is otherwise normal. IMPRESSION: 1. Mild prominence of the right hilum is favored to be technical in nature. Recommend a PA and lateral chest x-ray before discharge. 2. Small layering effusions with underlying opacities. 3. Left PICC line as above. Electronically Signed   By: Gerome Sam III M.D   On: 01/20/2018 09:29    Assessment/Plan Principal Problem:   Acute on chronic respiratory failure with hypoxia [facility]) Active Problems:   Cardiac arrest [facility])   Dysphagia   Cognitive dysfunction   Atrial fibrillation, chronic (HCC)   Lobar pneumonia (HCC)   C. difficile colitis   Encephalopathy chronic   1. Acute on chronic respiratory failure with hypoxia continues to do well with weaning on T collar will advance and hopefully be able to start capping trials soon we will continue secretion management pulmonary toilet 2. Chronic atrial fibrillation rate is controlled we will continue with present  management 3. Status post cardiac arrest at baseline 4. Chronic encephalopathy patient is clinically showing some improvement 5. Lobar pneumonia treated with antibiotics we will continue to follow radiologically   I have personally seen and evaluated the patient, evaluated laboratory and imaging results, formulated the assessment and plan and placed orders. The Patient requires high complexity decision making for assessment and support.  Case was discussed  on Rounds with the Respiratory Therapy Staff  Yevonne Pax, MD [facility] Pulmonary Critical Care Medicine Sleep Medicine

## 2018-01-23 ENCOUNTER — Other Ambulatory Visit (HOSPITAL_COMMUNITY): Payer: 59

## 2018-01-23 DIAGNOSIS — J9621 Acute and chronic respiratory failure with hypoxia: Secondary | ICD-10-CM | POA: Diagnosis not present

## 2018-01-23 DIAGNOSIS — I482 Chronic atrial fibrillation: Secondary | ICD-10-CM | POA: Diagnosis not present

## 2018-01-23 DIAGNOSIS — I469 Cardiac arrest, cause unspecified: Secondary | ICD-10-CM | POA: Diagnosis not present

## 2018-01-23 DIAGNOSIS — F09 Unspecified mental disorder due to known physiological condition: Secondary | ICD-10-CM | POA: Diagnosis not present

## 2018-01-23 LAB — BLOOD GAS, ARTERIAL
Acid-Base Excess: 15.8 mmol/L — ABNORMAL HIGH (ref 0.0–2.0)
Bicarbonate: 41 mmol/L — ABNORMAL HIGH (ref 20.0–28.0)
FIO2: 28
O2 Saturation: 90.9 %
PO2 ART: 61.1 mmHg — AB (ref 83.0–108.0)
Patient temperature: 98.6
pCO2 arterial: 60.8 mmHg — ABNORMAL HIGH (ref 32.0–48.0)
pH, Arterial: 7.444 (ref 7.350–7.450)

## 2018-01-23 LAB — BASIC METABOLIC PANEL
ANION GAP: 10 (ref 5–15)
BUN: 13 mg/dL (ref 6–20)
CHLORIDE: 94 mmol/L — AB (ref 101–111)
CO2: 39 mmol/L — ABNORMAL HIGH (ref 22–32)
Calcium: 10.1 mg/dL (ref 8.9–10.3)
Creatinine, Ser: 0.56 mg/dL (ref 0.44–1.00)
GFR calc Af Amer: >60 mL/min (ref >60)
GFR calc non Af Amer: >60 mL/min (ref >60)
GLUCOSE: 157 mg/dL — AB (ref 65–99)
POTASSIUM: 3.5 mmol/L (ref 3.5–5.1)
Sodium: 143 mmol/L (ref 135–145)

## 2018-01-23 LAB — CBC
HEMATOCRIT: 37.9 % (ref 36.0–46.0)
HEMOGLOBIN: 11.4 g/dL — AB (ref 12.0–15.0)
MCH: 30 pg (ref 26.0–34.0)
MCHC: 30.1 g/dL (ref 30.0–36.0)
MCV: 99.7 fL (ref 78.0–100.0)
Platelets: 278 10*3/uL (ref 150–400)
RBC: 3.8 MIL/uL — ABNORMAL LOW (ref 3.87–5.11)
RDW: 14.5 % (ref 11.5–15.5)
WBC: 8.6 10*3/uL (ref 4.0–10.5)

## 2018-01-23 LAB — MAGNESIUM: MAGNESIUM: 2 mg/dL (ref 1.7–2.4)

## 2018-01-23 NOTE — Progress Notes (Signed)
Pulmonary Critical Care Medicine [facility] [place]   PULMONARY SERVICE  PROGRESS NOTE  Date of Service: 01/23/2018  Ariana Warren  ZOX:[id number]  DOB: September 02, 1933   DOA: 2018/01/22  Referring Physician: Carron Curie, MD  HPI: Ariana Warren is a 84 y.o. female seen for follow up of Acute on Chronic Respiratory Failure.  Patient remains on T collar right now has been on 40% oxygen should be able to have the trach downsized today  Medications: Reviewed on Rounds  Physical Exam:  Vitals: Temperature 97.0 pulse 64 respiratory rate 28 blood pressure 107/59 saturations 99%  Ventilator Settings currently is on R/T collar FiO2 40%  . General: Comfortable at this time . Eyes: Grossly normal lids, irises & conjunctiva . ENT: grossly tongue is normal . Neck: no obvious mass . Cardiovascular: S1-S2 normal no gallop or rub . Respiratory: No rhonchi at this time . Abdomen: Soft nontender . Skin: no rash seen on limited exam . Musculoskeletal: not rigid . Psychiatric:unable to assess . Neurologic: no seizure no involuntary movements         Labs on Admission:  Basic Metabolic Panel: Recent Labs  Lab 01/20/18 0502 01/21/18 1017 01/22/18 1346 01/23/18 0950  NA 142  --   --  143  K 2.4* 3.4* 4.2 3.5  CL 98*  --   --  94*  CO2 34*  --   --  39*  GLUCOSE 117*  --   --  157*  BUN 6  --   --  13  CREATININE 0.58  --   --  0.56  CALCIUM 9.3  --   --  10.1  MG  --   --   --  2.0    Liver Function Tests: No results for input(s): AST, ALT, ALKPHOS, BILITOT, PROT, ALBUMIN in the last 168 hours. No results for input(s): LIPASE, AMYLASE in the last 168 hours. No results for input(s): AMMONIA in the last 168 hours.  CBC: Recent Labs  Lab 01/20/18 0502 01/23/18 0950  WBC 9.2 8.6  HGB 9.6* 11.4*  HCT 31.4* 37.9  MCV 99.4 99.7  PLT 261 278    Cardiac Enzymes: No results for input(s): CKTOTAL, CKMB, CKMBINDEX, TROPONINI in the last 168 hours.  BNP (last 3  results) No results for input(s): BNP in the last 8760 hours.  ProBNP (last 3 results) No results for input(s): PROBNP in the last 8760 hours.  Radiological Exams on Admission: Dg Chest Port 1 View  Result Date: 01/23/2018 CLINICAL DATA:  Congestive failure EXAM: PORTABLE CHEST 1 VIEW COMPARISON:  01/20/2018 FINDINGS: Cardiac shadow is prominent but stable from the prior exam. Tracheostomy tube is noted in satisfactory position. Previously seen left PICC line has been removed in the interval. Near complete resolution of bilateral effusions is noted with small right effusion seen. No focal confluent infiltrate is noted. IMPRESSION: Small right pleural effusion. Electronically Signed   By: Alcide Clever M.D.   On: 01/23/2018 14:43   Dg Chest Port 1 View  Result Date: 01/20/2018 CLINICAL DATA:  Shortness of breath EXAM: PORTABLE CHEST 1 VIEW COMPARISON:  None. FINDINGS: A left-sided PICC line terminates in the SVC just below the brachiocephalic confluence. No pneumothorax identified. However, the apices are partially obscured by the patient's overlapping chin. Haziness over the bases is most consistent with effusions with underlying opacities, possibly atelectasis. The right hilum is mildly prominent, possibly due to technique. The cardiomediastinal silhouette is otherwise normal. IMPRESSION: 1. Mild prominence of the right hilum  is favored to be technical in nature. Recommend a PA and lateral chest x-ray before discharge. 2. Small layering effusions with underlying opacities. 3. Left PICC line as above. Electronically Signed   By: Gerome Sam III M.D   On: 01/20/2018 09:29    Assessment/Plan Principal Problem:   Acute on chronic respiratory failure with hypoxia [facility]) Active Problems:   Cardiac arrest [facility])   Dysphagia   Cognitive dysfunction   Atrial fibrillation, chronic (HCC)   Lobar pneumonia (HCC)   C. difficile colitis   Encephalopathy chronic   1. Acute on chronic respiratory  failure with hypoxia we will continue with R/T collar continue secretion management pulmonary toilet 2. Status post cardiac arrest at baseline 3. Cognitive dysfunction grossly unchanged we will continue to follow 4. Chronic atrial fibrillation rate is controlled 5. Encephalopathy at baseline 6. Lobar pneumonia treated with antibiotics the last chest x-ray showed some mild prominence of the right hilum probably technique related   I have personally seen and evaluated the patient, evaluated laboratory and imaging results, formulated the assessment and plan and placed orders. The Patient requires high complexity decision making for assessment and support.  Case was discussed on Rounds with the Respiratory Therapy Staff  Yevonne Pax, MD [facility] Pulmonary Critical Care Medicine Sleep Medicine

## 2018-01-24 DIAGNOSIS — I5020 Unspecified systolic (congestive) heart failure: Secondary | ICD-10-CM | POA: Diagnosis not present

## 2018-01-24 DIAGNOSIS — J181 Lobar pneumonia, unspecified organism: Secondary | ICD-10-CM | POA: Diagnosis not present

## 2018-01-24 DIAGNOSIS — F09 Unspecified mental disorder due to known physiological condition: Secondary | ICD-10-CM | POA: Diagnosis not present

## 2018-01-24 DIAGNOSIS — G9349 Other encephalopathy: Secondary | ICD-10-CM | POA: Diagnosis not present

## 2018-01-24 DIAGNOSIS — I469 Cardiac arrest, cause unspecified: Secondary | ICD-10-CM | POA: Diagnosis not present

## 2018-01-24 DIAGNOSIS — I482 Chronic atrial fibrillation: Secondary | ICD-10-CM | POA: Diagnosis not present

## 2018-01-24 DIAGNOSIS — J9621 Acute and chronic respiratory failure with hypoxia: Secondary | ICD-10-CM | POA: Diagnosis not present

## 2018-01-24 NOTE — Progress Notes (Signed)
Pulmonary Critical Care Medicine [facility] [place]   PULMONARY SERVICE  PROGRESS NOTE  Date of Service: 01/24/2018  Takirah Binford  RUE:[id number]  DOB: [birth year]   DOA: 21-Jan-2018  Referring Physician: Carron Curie, MD  HPI: Eleesha Purkey is a 84 y.o. female seen for follow up of Acute on Chronic Respiratory Failure.  Patient is currently on her/T collar patient is on 35% oxygen at this time.  Has been tolerating PMV  Medications: Reviewed on Rounds  Physical Exam:  Vitals: Temperature 97.6 pulse 96 respiratory rate 30 blood pressure 144/68 saturations 98%  Ventilator Settings off of the ventilator on T collar  . General: Comfortable at this time . Eyes: Grossly normal lids, irises & conjunctiva . ENT: grossly tongue is normal . Neck: no obvious mass . Cardiovascular: S1-S2 normal no gallop or rub . Respiratory: No rhonchi expansion . Abdomen: Soft nondistended . Skin: no rash seen on limited exam . Musculoskeletal: not rigid . Psychiatric:unable to assess . Neurologic: no seizure no involuntary movements         Labs on Admission:  Basic Metabolic Panel: Recent Labs  Lab 01/20/18 0502 01/21/18 1017 01/22/18 1346 01/23/18 0950  NA 142  --   --  143  K 2.4* 3.4* 4.2 3.5  CL 98*  --   --  94*  CO2 34*  --   --  39*  GLUCOSE 117*  --   --  157*  BUN 6  --   --  13  CREATININE 0.58  --   --  0.56  CALCIUM 9.3  --   --  10.1  MG  --   --   --  2.0    Liver Function Tests: No results for input(s): AST, ALT, ALKPHOS, BILITOT, PROT, ALBUMIN in the last 168 hours. No results for input(s): LIPASE, AMYLASE in the last 168 hours. No results for input(s): AMMONIA in the last 168 hours.  CBC: Recent Labs  Lab 01/20/18 0502 01/23/18 0950  WBC 9.2 8.6  HGB 9.6* 11.4*  HCT 31.4* 37.9  MCV 99.4 99.7  PLT 261 278    Cardiac Enzymes: No results for input(s): CKTOTAL, CKMB, CKMBINDEX, TROPONINI in the last 168 hours.  BNP (last 3 results) No  results for input(s): BNP in the last 8760 hours.  ProBNP (last 3 results) No results for input(s): PROBNP in the last 8760 hours.  Radiological Exams on Admission: Dg Chest Port 1 View  Result Date: 01/23/2018 CLINICAL DATA:  Congestive failure EXAM: PORTABLE CHEST 1 VIEW COMPARISON:  01/20/2018 FINDINGS: Cardiac shadow is prominent but stable from the prior exam. Tracheostomy tube is noted in satisfactory position. Previously seen left PICC line has been removed in the interval. Near complete resolution of bilateral effusions is noted with small right effusion seen. No focal confluent infiltrate is noted. IMPRESSION: Small right pleural effusion. Electronically Signed   By: Alcide Clever M.D.   On: 01/23/2018 14:43    Assessment/Plan Principal Problem:   Acute on chronic respiratory failure with hypoxia (HCC) Active Problems:   Cardiac arrest (HCC)   Dysphagia   Cognitive dysfunction   Atrial fibrillation, chronic (HCC)   Lobar pneumonia (HCC)   C. difficile colitis   Encephalopathy chronic   1. Acute on chronic respiratory failure with hypoxia continue with T collar trials as ordered.  We will continue pulmonary toilet supportive care titrate oxygen as tolerated 2. Status post cardiac arrest remains grossly unchanged 3. Cognitive dysfunction at baseline 4. Lobar pneumonia  treated with antibiotics we will continue to monitor 5. Chronic encephalopathy as above   I have personally seen and evaluated the patient, evaluated laboratory and imaging results, formulated the assessment and plan and placed orders. The Patient requires high complexity decision making for assessment and support.  Case was discussed on Rounds with the Respiratory Therapy Staff  Yevonne Pax, MD [facility] Pulmonary Critical Care Medicine Sleep Medicine

## 2018-01-25 ENCOUNTER — Other Ambulatory Visit (HOSPITAL_COMMUNITY): Payer: 59

## 2018-01-25 DIAGNOSIS — J9621 Acute and chronic respiratory failure with hypoxia: Secondary | ICD-10-CM | POA: Diagnosis not present

## 2018-01-25 DIAGNOSIS — I469 Cardiac arrest, cause unspecified: Secondary | ICD-10-CM | POA: Diagnosis not present

## 2018-01-25 DIAGNOSIS — I482 Chronic atrial fibrillation: Secondary | ICD-10-CM | POA: Diagnosis not present

## 2018-01-25 DIAGNOSIS — I5020 Unspecified systolic (congestive) heart failure: Secondary | ICD-10-CM | POA: Diagnosis not present

## 2018-01-25 NOTE — Progress Notes (Signed)
Pulmonary Critical Care Medicine [facility] [place]   PULMONARY SERVICE  PROGRESS NOTE  Date of Service: 01/25/2018  Ariana Warren  ZOX:[id number]  DOB: [birth year]   DOA: 02-01-18  Referring Physician: Carron Curie, MD  HPI: Ariana Warren is a 84 y.o. female seen for follow up of Acute on Chronic Respiratory Failure.  Currently is on T collar with the PMV has been tolerating it fairly well.  No distress at this time  Medications: Reviewed on Rounds  Physical Exam:  Vitals: Temperature 97.4 pulse 104 respiratory rate 23 blood pressure 144/72 saturations 99%  Ventilator Settings aerosolized T collar 28% FiO2  . General: Comfortable at this time . Eyes: Grossly normal lids, irises & conjunctiva . ENT: grossly tongue is normal . Neck: no obvious mass . Cardiovascular: S1-S2 normal no gallop or rub . Respiratory: Coarse breath sounds no rhonchi . Abdomen: Soft nontender . Skin: no rash seen on limited exam . Musculoskeletal: not rigid . Psychiatric:unable to assess . Neurologic: no seizure no involuntary movements         Labs on Admission:  Basic Metabolic Panel: Recent Labs  Lab 01/20/18 0502 01/21/18 1017 01/22/18 1346 01/23/18 0950  NA 142  --   --  143  K 2.4* 3.4* 4.2 3.5  CL 98*  --   --  94*  CO2 34*  --   --  39*  GLUCOSE 117*  --   --  157*  BUN 6  --   --  13  CREATININE 0.58  --   --  0.56  CALCIUM 9.3  --   --  10.1  MG  --   --   --  2.0    Liver Function Tests: No results for input(s): AST, ALT, ALKPHOS, BILITOT, PROT, ALBUMIN in the last 168 hours. No results for input(s): LIPASE, AMYLASE in the last 168 hours. No results for input(s): AMMONIA in the last 168 hours.  CBC: Recent Labs  Lab 01/20/18 0502 01/23/18 0950  WBC 9.2 8.6  HGB 9.6* 11.4*  HCT 31.4* 37.9  MCV 99.4 99.7  PLT 261 278    Cardiac Enzymes: No results for input(s): CKTOTAL, CKMB, CKMBINDEX, TROPONINI in the last 168 hours.  BNP (last 3 results) No  results for input(s): BNP in the last 8760 hours.  ProBNP (last 3 results) No results for input(s): PROBNP in the last 8760 hours.  Radiological Exams on Admission: Dg Chest Port 1 View  Result Date: 01/23/2018 CLINICAL DATA:  Congestive failure EXAM: PORTABLE CHEST 1 VIEW COMPARISON:  01/20/2018 FINDINGS: Cardiac shadow is prominent but stable from the prior exam. Tracheostomy tube is noted in satisfactory position. Previously seen left PICC line has been removed in the interval. Near complete resolution of bilateral effusions is noted with small right effusion seen. No focal confluent infiltrate is noted. IMPRESSION: Small right pleural effusion. Electronically Signed   By: Alcide Clever M.D.   On: 01/23/2018 14:43    Assessment/Plan Principal Problem:   Acute on chronic respiratory failure with hypoxia (HCC) Active Problems:   Cardiac arrest (HCC)   Dysphagia   Cognitive dysfunction   Atrial fibrillation, chronic (HCC)   Lobar pneumonia (HCC)   C. difficile colitis   Encephalopathy chronic   1. Acute on chronic respiratory failure with hypoxia patient is tolerating T collar fairly well.  Patient is also been tolerating the PMV well we will continue with PMV and T collar weans continue secretion management pulmonary toilet 2. Cognitive dysfunction she  is at baseline we will continue to monitor 3. Chronic atrial fibrillation rate is controlled 4. Lobar pneumonia treated with antibiotics 5. Chronic encephalopathy at baseline 6. Status post cardiac arrest has been hemodynamically stable   I have personally seen and evaluated the patient, evaluated laboratory and imaging results, formulated the assessment and plan and placed orders. The Patient requires high complexity decision making for assessment and support.  Case was discussed on Rounds with the Respiratory Therapy Staff  Yevonne Pax, MD [facility] Pulmonary Critical Care Medicine Sleep Medicine

## 2018-01-26 DIAGNOSIS — I482 Chronic atrial fibrillation: Secondary | ICD-10-CM | POA: Diagnosis not present

## 2018-01-26 DIAGNOSIS — I5020 Unspecified systolic (congestive) heart failure: Secondary | ICD-10-CM

## 2018-01-26 DIAGNOSIS — I469 Cardiac arrest, cause unspecified: Secondary | ICD-10-CM | POA: Diagnosis not present

## 2018-01-26 DIAGNOSIS — J9621 Acute and chronic respiratory failure with hypoxia: Secondary | ICD-10-CM | POA: Diagnosis not present

## 2018-01-26 LAB — BASIC METABOLIC PANEL
ANION GAP: 10 (ref 5–15)
BUN: 20 mg/dL (ref 6–20)
CO2: 39 mmol/L — AB (ref 22–32)
Calcium: 9.8 mg/dL (ref 8.9–10.3)
Chloride: 91 mmol/L — ABNORMAL LOW (ref 101–111)
Creatinine, Ser: 0.52 mg/dL (ref 0.44–1.00)
GFR calc Af Amer: >60 mL/min (ref >60)
GFR calc non Af Amer: >60 mL/min (ref >60)
GLUCOSE: 162 mg/dL — AB (ref 65–99)
POTASSIUM: 3.3 mmol/L — AB (ref 3.5–5.1)
Sodium: 140 mmol/L (ref 135–145)

## 2018-01-26 NOTE — Progress Notes (Signed)
Pulmonary Critical Care Medicine [facility] [place]   PULMONARY SERVICE  PROGRESS NOTE  Date of Service: 01/26/2018  Ariana Warren  ZOX:[id number]  DOB: 1932-11-02   DOA: 12/29/2017  Referring Physician: Carron Curie, MD  HPI: Ariana Warren is a 84 y.o. female seen for follow up of Acute on Chronic Respiratory Failure.  Patient right now is on T collar has been on 28% secretions are still significant but the patient is tolerating PMV fairly well  Medications: Reviewed on Rounds  Physical Exam:  Vitals: Temperature 96.8 pulse 128 respiratory rate 19 blood pressure 131/65 saturations 95%  Ventilator Settings on T collar trials  . General: Comfortable at this time . Eyes: Grossly normal lids, irises & conjunctiva . ENT: grossly tongue is normal . Neck: no obvious mass . Cardiovascular: S1-S2 normal no gallop or rub . Respiratory: No rhonchi expansion is equal . Abdomen: Soft and nontender . Skin: no rash seen on limited exam . Musculoskeletal: not rigid . Psychiatric:unable to assess . Neurologic: no seizure no involuntary movements         Labs on Admission:  Basic Metabolic Panel: Recent Labs  Lab 01/20/18 0502 01/21/18 1017 01/22/18 1346 01/23/18 0950 01/26/18 0715  NA 142  --   --  143 140  K 2.4* 3.4* 4.2 3.5 3.3*  CL 98*  --   --  94* 91*  CO2 34*  --   --  39* 39*  GLUCOSE 117*  --   --  157* 162*  BUN 6  --   --  13 20  CREATININE 0.58  --   --  0.56 0.52  CALCIUM 9.3  --   --  10.1 9.8  MG  --   --   --  2.0  --     Liver Function Tests: No results for input(s): AST, ALT, ALKPHOS, BILITOT, PROT, ALBUMIN in the last 168 hours. No results for input(s): LIPASE, AMYLASE in the last 168 hours. No results for input(s): AMMONIA in the last 168 hours.  CBC: Recent Labs  Lab 01/20/18 0502 01/23/18 0950  WBC 9.2 8.6  HGB 9.6* 11.4*  HCT 31.4* 37.9  MCV 99.4 99.7  PLT 261 278    Cardiac Enzymes: No results for input(s): CKTOTAL, CKMB,  CKMBINDEX, TROPONINI in the last 168 hours.  BNP (last 3 results) No results for input(s): BNP in the last 8760 hours.  ProBNP (last 3 results) No results for input(s): PROBNP in the last 8760 hours.  Radiological Exams on Admission: Dg Elbow Complete Right (3+view)  Result Date: 01/25/2018 CLINICAL DATA:  Right elbow pain. EXAM: RIGHT ELBOW - COMPLETE 3+ VIEW COMPARISON:  None. FINDINGS: No definite fracture or elbow joint effusion. Joint spaces appear grossly preserved. Minimal enthesopathic change involving the triceps tendon insertion site. Several ossicles are noted about the lateral epicondyle, likely the sequela of remote avulsive injury. PICC line tubing overlies the upper arm. Regional soft tissues appear otherwise normal. IMPRESSION: No definite fracture or elbow joint effusion. Electronically Signed   By: Simonne Come M.D.   On: 01/25/2018 14:52   Dg Chest Port 1 View  Result Date: 01/23/2018 CLINICAL DATA:  Congestive failure EXAM: PORTABLE CHEST 1 VIEW COMPARISON:  01/20/2018 FINDINGS: Cardiac shadow is prominent but stable from the prior exam. Tracheostomy tube is noted in satisfactory position. Previously seen left PICC line has been removed in the interval. Near complete resolution of bilateral effusions is noted with small right effusion seen. No focal confluent infiltrate  is noted. IMPRESSION: Small right pleural effusion. Electronically Signed   By: Alcide Clever M.D.   On: 01/23/2018 14:43    Assessment/Plan Principal Problem:   Acute on chronic respiratory failure with hypoxia (HCC) Active Problems:   Cardiac arrest (HCC)   Dysphagia   Cognitive dysfunction   Atrial fibrillation, chronic (HCC)   Lobar pneumonia (HCC)   C. difficile colitis   Encephalopathy chronic   1. Acute on chronic respiratory failure with hypoxia we will continue with T collar trials as ordered and PMV as tolerated she needs ongoing aggressive pulmonary toilet 2. Cognitive dysfunction at  baseline we will continue to monitor 3. Chronic atrial fibrillation rate is controlled right now we will continue with supportive care 4. Lobar pneumonia treated with antibiotics with near complete resolution 5. Chronic encephalopathy is grossly unchanged 6. Status post cardiac arrest as above   I have personally seen and evaluated the patient, evaluated laboratory and imaging results, formulated the assessment and plan and placed orders. The Patient requires high complexity decision making for assessment and support.  Case was discussed on Rounds with the Respiratory Therapy Staff  Yevonne Pax, MD [facility] Pulmonary Critical Care Medicine Sleep Medicine

## 2018-01-27 DIAGNOSIS — J9621 Acute and chronic respiratory failure with hypoxia: Secondary | ICD-10-CM | POA: Diagnosis not present

## 2018-01-27 DIAGNOSIS — I482 Chronic atrial fibrillation: Secondary | ICD-10-CM | POA: Diagnosis not present

## 2018-01-27 DIAGNOSIS — F09 Unspecified mental disorder due to known physiological condition: Secondary | ICD-10-CM | POA: Diagnosis not present

## 2018-01-27 DIAGNOSIS — I469 Cardiac arrest, cause unspecified: Secondary | ICD-10-CM | POA: Diagnosis not present

## 2018-01-27 LAB — BASIC METABOLIC PANEL
ANION GAP: 10 (ref 5–15)
BUN: 19 mg/dL (ref 6–20)
CO2: 40 mmol/L — AB (ref 22–32)
Calcium: 10.1 mg/dL (ref 8.9–10.3)
Chloride: 89 mmol/L — ABNORMAL LOW (ref 101–111)
Creatinine, Ser: 0.55 mg/dL (ref 0.44–1.00)
GLUCOSE: 178 mg/dL — AB (ref 65–99)
POTASSIUM: 3.5 mmol/L (ref 3.5–5.1)
Sodium: 139 mmol/L (ref 135–145)

## 2018-01-27 NOTE — Progress Notes (Signed)
Pulmonary Critical Care Medicine [facility] [place]   PULMONARY SERVICE  PROGRESS NOTE  Date of Service: 01/27/2018  Ariana Warren  ZOX:[id number]  DOB: 07-11-1933   DOA: 12/19/2017  Referring Physician: Carron Curie, MD  HPI: Ariana Warren is a 84 y.o. female seen for follow up of Acute on Chronic Respiratory Failure.  Patient is on T collar trials right now has been on 20% oxygen tolerating the PMV fairly well  Medications: Reviewed on Rounds  Physical Exam:  Vitals: Temperature 97.2 pulse 64 respiratory rate 33 blood pressure 132/42 saturations 97%  Ventilator Settings aerosolized T collar 28%  . General: Comfortable at this time . Eyes: Grossly normal lids, irises & conjunctiva . ENT: grossly tongue is normal . Neck: no obvious mass . Cardiovascular: S1-S2 normal no gallop or rub . Respiratory: No rhonchi expansion equal . Abdomen: Soft and nontender . Skin: no rash seen on limited exam . Musculoskeletal: not rigid . Psychiatric:unable to assess . Neurologic: no seizure no involuntary movements         Labs on Admission:  Basic Metabolic Panel: Recent Labs  Lab 01/21/18 1017 01/22/18 1346 01/23/18 0950 01/26/18 0715 01/27/18 0550  NA  --   --  143 140 139  K 3.4* 4.2 3.5 3.3* 3.5  CL  --   --  94* 91* 89*  CO2  --   --  39* 39* 40*  GLUCOSE  --   --  157* 162* 178*  BUN  --   --  CREATININE  --   --  0.56 0.52 0.55  CALCIUM  --   --  10.1 9.8 10.1  MG  --   --  2.0  --   --     Liver Function Tests: No results for input(s): AST, ALT, ALKPHOS, BILITOT, PROT, ALBUMIN in the last 168 hours. No results for input(s): LIPASE, AMYLASE in the last 168 hours. No results for input(s): AMMONIA in the last 168 hours.  CBC: Recent Labs  Lab 01/23/18 0950  WBC 8.6  HGB 11.4*  HCT 37.9  MCV 99.7  PLT 278    Cardiac Enzymes: No results for input(s): CKTOTAL, CKMB, CKMBINDEX, TROPONINI in the last 168 hours.  BNP (last 3  results) No results for input(s): BNP in the last 8760 hours.  ProBNP (last 3 results) No results for input(s): PROBNP in the last 8760 hours.  Radiological Exams on Admission: Dg Elbow Complete Right (3+view)  Result Date: 01/25/2018 CLINICAL DATA:  Right elbow pain. EXAM: RIGHT ELBOW - COMPLETE 3+ VIEW COMPARISON:  None. FINDINGS: No definite fracture or elbow joint effusion. Joint spaces appear grossly preserved. Minimal enthesopathic change involving the triceps tendon insertion site. Several ossicles are noted about the lateral epicondyle, likely the sequela of remote avulsive injury. PICC line tubing overlies the upper arm. Regional soft tissues appear otherwise normal. IMPRESSION: No definite fracture or elbow joint effusion. Electronically Signed   By: Simonne Come M.D.   On: 01/25/2018 14:52    Assessment/Plan Principal Problem:   Acute on chronic respiratory failure with hypoxia (HCC) Active Problems:   Cardiac arrest (HCC)   Dysphagia   Cognitive dysfunction   Atrial fibrillation, chronic (HCC)   Lobar pneumonia (HCC)   C. difficile colitis   Encephalopathy chronic   1. Acute on chronic respiratory failure with hypoxia she continues to wean has been doing well with the PMV also will continue with supportive care 2. Status post cardiac arrest grossly  unchanged we will continue to monitor 3. Chronic atrial fibrillation rate is controlled we will follow along 4. Lobar pneumonia treated with antibiotics we will continue to follow 5. Encephalopathy waxing and waning status we will continue to monitor 6. Cognitive dysfunction at baseline we will monitor   I have personally seen and evaluated the patient, evaluated laboratory and imaging results, formulated the assessment and plan and placed orders. The Patient requires high complexity decision making for assessment and support.  Case was discussed on Rounds with the Respiratory Therapy Staff  Yevonne Pax, MD [facility] Pulmonary  Critical Care Medicine Sleep Medicine

## 2018-01-28 DIAGNOSIS — F09 Unspecified mental disorder due to known physiological condition: Secondary | ICD-10-CM | POA: Diagnosis not present

## 2018-01-28 DIAGNOSIS — J9621 Acute and chronic respiratory failure with hypoxia: Secondary | ICD-10-CM | POA: Diagnosis not present

## 2018-01-28 DIAGNOSIS — I482 Chronic atrial fibrillation: Secondary | ICD-10-CM | POA: Diagnosis not present

## 2018-01-28 DIAGNOSIS — G9349 Other encephalopathy: Secondary | ICD-10-CM | POA: Diagnosis not present

## 2018-01-28 LAB — BASIC METABOLIC PANEL
Anion gap: 10 (ref 5–15)
BUN: 15 mg/dL (ref 6–20)
CALCIUM: 10 mg/dL (ref 8.9–10.3)
CO2: 42 mmol/L — ABNORMAL HIGH (ref 22–32)
Chloride: 86 mmol/L — ABNORMAL LOW (ref 101–111)
Creatinine, Ser: 0.49 mg/dL (ref 0.44–1.00)
Glucose, Bld: 153 mg/dL — ABNORMAL HIGH (ref 65–99)
Potassium: 3.6 mmol/L (ref 3.5–5.1)
Sodium: 138 mmol/L (ref 135–145)

## 2018-01-28 NOTE — Progress Notes (Signed)
Pulmonary Critical Care Medicine [facility] [place]   PULMONARY SERVICE  PROGRESS NOTE  Date of Service: 01/28/2018  Ariana Warren  ZOX:[id number]  DOB: February 17, 1933   DOA: 12/19/2017  Referring Physician: Carron Curie, MD  HPI: Ariana Warren is a 84 y.o. female seen for follow up of Acute on Chronic Respiratory Failure.  She is on T collar has been doing fine except secretions.  Secretions are somewhat thick tenacious.  She needs suctioning about twice per shift right now  Medications: Reviewed on Rounds  Physical Exam:  Vitals: Temperature 98.1 pulse 60 respiratory rate 15 blood pressure 107/80 saturations 100%  Ventilator Settings aerosolized T collar FiO2 28%  . General: Comfortable at this time . Eyes: Grossly normal lids, irises & conjunctiva . ENT: grossly tongue is normal . Neck: no obvious mass . Cardiovascular: S1-S2 normal no gallop or rub . Respiratory: No rhonchi expansion equal . Abdomen: Soft nontender . Skin: no rash seen on limited exam . Musculoskeletal: not rigid . Psychiatric:unable to assess . Neurologic: no seizure no involuntary movements         Labs on Admission:  Basic Metabolic Panel: Recent Labs  Lab 01/22/18 1346 01/23/18 0950 01/26/18 0715 01/27/18 0550 01/28/18 0527  NA  --  143 140 139 138  K 4.2 3.5 3.3* 3.5 3.6  CL  --  94* 91* 89* 86*  CO2  --  39* 39* 40* 42*  GLUCOSE  --  157* 162* 178* 153*  BUN  --  CREATININE  --  0.56 0.52 0.55 0.49  CALCIUM  --  10.1 9.8 10.1 10.0  MG  --  2.0  --   --   --     Liver Function Tests: No results for input(s): AST, ALT, ALKPHOS, BILITOT, PROT, ALBUMIN in the last 168 hours. No results for input(s): LIPASE, AMYLASE in the last 168 hours. No results for input(s): AMMONIA in the last 168 hours.  CBC: Recent Labs  Lab 01/23/18 0950  WBC 8.6  HGB 11.4*  HCT 37.9  MCV 99.7  PLT 278    Cardiac Enzymes: No results for input(s): CKTOTAL, CKMB, CKMBINDEX,  TROPONINI in the last 168 hours.  BNP (last 3 results) No results for input(s): BNP in the last 8760 hours.  ProBNP (last 3 results) No results for input(s): PROBNP in the last 8760 hours.  Radiological Exams on Admission: Dg Elbow Complete Right (3+view)  Result Date: 01/25/2018 CLINICAL DATA:  Right elbow pain. EXAM: RIGHT ELBOW - COMPLETE 3+ VIEW COMPARISON:  None. FINDINGS: No definite fracture or elbow joint effusion. Joint spaces appear grossly preserved. Minimal enthesopathic change involving the triceps tendon insertion site. Several ossicles are noted about the lateral epicondyle, likely the sequela of remote avulsive injury. PICC line tubing overlies the upper arm. Regional soft tissues appear otherwise normal. IMPRESSION: No definite fracture or elbow joint effusion. Electronically Signed   By: Simonne Come M.D.   On: 01/25/2018 14:52    Assessment/Plan Principal Problem:   Acute on chronic respiratory failure with hypoxia (HCC) Active Problems:   Cardiac arrest (HCC)   Dysphagia   Cognitive dysfunction   Atrial fibrillation, chronic (HCC)   Lobar pneumonia (HCC)   C. difficile colitis   Encephalopathy chronic   1. Acute on chronic respiratory failure with hypoxia we will continue with weaning on T collar continue pulmonary toilet supportive care patient is doing better from a weaning perspective but secretions are still significant. 2. Cognitive  dysfunction she is at baseline we will continue present management 3. Chronic atrial fibrillation rate is controlled we will follow along 4. Lobar pneumonia treated with antibiotics 5. Chronic encephalopathy at baseline we will monitor   I have personally seen and evaluated the patient, evaluated laboratory and imaging results, formulated the assessment and plan and placed orders. The Patient requires high complexity decision making for assessment and support.  Case was discussed on Rounds with the Respiratory Therapy  Staff  Yevonne Pax, MD [facility] Pulmonary Critical Care Medicine Sleep Medicine

## 2018-01-29 DIAGNOSIS — G9349 Other encephalopathy: Secondary | ICD-10-CM | POA: Diagnosis not present

## 2018-01-29 DIAGNOSIS — J181 Lobar pneumonia, unspecified organism: Secondary | ICD-10-CM | POA: Diagnosis not present

## 2018-01-29 DIAGNOSIS — J9621 Acute and chronic respiratory failure with hypoxia: Secondary | ICD-10-CM | POA: Diagnosis not present

## 2018-01-29 DIAGNOSIS — I482 Chronic atrial fibrillation: Secondary | ICD-10-CM | POA: Diagnosis not present

## 2018-01-29 DIAGNOSIS — F09 Unspecified mental disorder due to known physiological condition: Secondary | ICD-10-CM | POA: Diagnosis not present

## 2018-01-29 DIAGNOSIS — I469 Cardiac arrest, cause unspecified: Secondary | ICD-10-CM | POA: Diagnosis not present

## 2018-01-29 LAB — CBC
HEMATOCRIT: 30.6 % — AB (ref 36.0–46.0)
HEMOGLOBIN: 9.3 g/dL — AB (ref 12.0–15.0)
MCH: 29.7 pg (ref 26.0–34.0)
MCHC: 30.4 g/dL (ref 30.0–36.0)
MCV: 97.8 fL (ref 78.0–100.0)
Platelets: 260 10*3/uL (ref 150–400)
RBC: 3.13 MIL/uL — ABNORMAL LOW (ref 3.87–5.11)
RDW: 14.1 % (ref 11.5–15.5)
WBC: 14 10*3/uL — AB (ref 4.0–10.5)

## 2018-01-29 LAB — URINALYSIS, ROUTINE W REFLEX MICROSCOPIC
Bilirubin Urine: NEGATIVE
GLUCOSE, UA: NEGATIVE mg/dL
Hgb urine dipstick: NEGATIVE
Ketones, ur: NEGATIVE mg/dL
LEUKOCYTES UA: NEGATIVE
Nitrite: NEGATIVE
PH: 6 (ref 5.0–8.0)
PROTEIN: NEGATIVE mg/dL
SPECIFIC GRAVITY, URINE: 1.014 (ref 1.005–1.030)

## 2018-01-29 LAB — TYPE AND SCREEN
ABO/RH(D): A POS
Antibody Screen: NEGATIVE

## 2018-01-29 LAB — BASIC METABOLIC PANEL
ANION GAP: 12 (ref 5–15)
BUN: 18 mg/dL (ref 6–20)
CALCIUM: 9.9 mg/dL (ref 8.9–10.3)
CO2: 38 mmol/L — AB (ref 22–32)
CREATININE: 0.62 mg/dL (ref 0.44–1.00)
Chloride: 88 mmol/L — ABNORMAL LOW (ref 101–111)
GFR calc Af Amer: >60 mL/min (ref >60)
Glucose, Bld: 191 mg/dL — ABNORMAL HIGH (ref 65–99)
Potassium: 3.5 mmol/L (ref 3.5–5.1)
SODIUM: 138 mmol/L (ref 135–145)

## 2018-01-29 LAB — PROTIME-INR
INR: 1.28
Prothrombin Time: 15.8 seconds — ABNORMAL HIGH (ref 11.4–15.2)

## 2018-01-29 LAB — ABO/RH: ABO/RH(D): A POS

## 2018-01-29 NOTE — Progress Notes (Signed)
Pulmonary Critical Care Medicine [facility] [place]   PULMONARY SERVICE  PROGRESS NOTE  Date of Service: 01/29/2018  Ariana Warren  ZOX:[id number]  DOB: 06-Apr-1933   DOA: 01/10/2018  Referring Physician: Carron Curie, MD  HPI: Ariana Warren is a 84 y.o. female seen for follow up of Acute on Chronic Respiratory Failure.  Patient is on T collar trials were now tolerating PMV fairly well.  Comfortable without distress at this time  Medications: Reviewed on Rounds  Physical Exam:  Vitals: Temperature 96.8 pulse 80 respiratory rate 27 blood pressure 152/64 saturations 98%  Ventilator Settings off of the ventilator  . General: Comfortable at this time . Eyes: Grossly normal lids, irises & conjunctiva . ENT: grossly tongue is normal . Neck: no obvious mass . Cardiovascular: S1-S2 normal no gallop or rub . Respiratory: No rhonchi expansion equal . Abdomen: Soft nontender . Skin: no rash seen on limited exam . Musculoskeletal: not rigid . Psychiatric:unable to assess . Neurologic: no seizure no involuntary movements         Labs on Admission:  Basic Metabolic Panel: Recent Labs  Lab 01/23/18 0950 01/26/18 0715 01/27/18 0550 01/28/18 0527 01/29/18 0651  NA 143 140 139 138 138  K 3.5 3.3* 3.5 3.6 3.5  CL 94* 91* 89* 86* 88*  CO2 39* 39* 40* 42* 38*  GLUCOSE 157* 162* 178* 153* 191*  BUN CREATININE 0.56 0.52 0.55 0.49 0.62  CALCIUM 10.1 9.8 10.1 10.0 9.9  MG 2.0  --   --   --   --     Liver Function Tests: No results for input(s): AST, ALT, ALKPHOS, BILITOT, PROT, ALBUMIN in the last 168 hours. No results for input(s): LIPASE, AMYLASE in the last 168 hours. No results for input(s): AMMONIA in the last 168 hours.  CBC: Recent Labs  Lab 01/23/18 0950 01/29/18 0651  WBC 8.6 14.0*  HGB 11.4* 9.3*  HCT 37.9 30.6*  MCV 99.7 97.8  PLT 278 260    Cardiac Enzymes: No results for input(s): CKTOTAL, CKMB, CKMBINDEX, TROPONINI in the last  168 hours.  BNP (last 3 results) No results for input(s): BNP in the last 8760 hours.  ProBNP (last 3 results) No results for input(s): PROBNP in the last 8760 hours.  Radiological Exams on Admission: No results found.  Assessment/Plan Principal Problem:   Acute on chronic respiratory failure with hypoxia (HCC) Active Problems:   Cardiac arrest (HCC)   Dysphagia   Cognitive dysfunction   Atrial fibrillation, chronic (HCC)   Lobar pneumonia (HCC)   C. difficile colitis   Encephalopathy chronic   1. Acute on chronic respiratory failure with hypoxia we will continue with weaning on T collar and PMV hopefully will be able to start capping 2. Status post cardiac arrest grossly unchanged 3. Cognitive dysfunction at baseline we will continue to follow 4. Chronic atrial fibrillation rate is controlled we will monitor 5. Lobar pneumonia treated 6. Chronic encephalopathy at baseline   I have personally seen and evaluated the patient, evaluated laboratory and imaging results, formulated the assessment and plan and placed orders. The Patient requires high complexity decision making for assessment and support.  Case was discussed on Rounds with the Respiratory Therapy Staff  Yevonne Pax, MD [facility] Pulmonary Critical Care Medicine Sleep Medicine

## 2018-01-30 DIAGNOSIS — J9621 Acute and chronic respiratory failure with hypoxia: Secondary | ICD-10-CM | POA: Diagnosis not present

## 2018-01-30 DIAGNOSIS — G9349 Other encephalopathy: Secondary | ICD-10-CM | POA: Diagnosis not present

## 2018-01-30 DIAGNOSIS — I482 Chronic atrial fibrillation: Secondary | ICD-10-CM | POA: Diagnosis not present

## 2018-01-30 DIAGNOSIS — I469 Cardiac arrest, cause unspecified: Secondary | ICD-10-CM | POA: Diagnosis not present

## 2018-01-30 NOTE — Progress Notes (Signed)
Pulmonary Critical Care Medicine [facility] [place]   PULMONARY SERVICE  PROGRESS NOTE  Date of Service: 01/30/2018  Ariana Warren  [id number]  DOB: Feb 05, 1933   DOA: 12/20/2017  Referring Physician: Carron Curie, MD  HPI: Ariana Warren is a 84 y.o. female seen for follow up of Acute on Chronic Respiratory Failure.  She is weaning on T collar has been tolerating PMV fairly well  Medications: Reviewed on Rounds  Physical Exam:  Vitals: Temperature 98.4 pulse 60 respiratory rate 20 blood pressure 124/56 saturations 94%  Ventilator Settings T collar PMV I will continue  . General: Comfortable at this time . Eyes: Grossly normal lids, irises & conjunctiva . ENT: grossly tongue is normal . Neck: no obvious mass . Cardiovascular: S1-S2 normal no gallop or rub . Respiratory: No rhonchi expansion is equal . Abdomen: Soft nontender . Skin: no rash seen on limited exam . Musculoskeletal: not rigid . Psychiatric:unable to assess . Neurologic: no seizure no involuntary movements         Labs on Admission:  Basic Metabolic Panel: Recent Labs  Lab 01/26/18 0715 01/27/18 0550 01/28/18 0527 01/29/18 0651  NA 140 139 138 138  K 3.3* 3.5 3.6 3.5  CL 91* 89* 86* 88*  CO2 39* 40* 42* 38*  GLUCOSE 162* 178* 153* 191*  BUN CREATININE 0.52 0.55 0.49 0.62  CALCIUM 9.8 10.1 10.0 9.9    Liver Function Tests: No results for input(s): AST, ALT, ALKPHOS, BILITOT, PROT, ALBUMIN in the last 168 hours. No results for input(s): LIPASE, AMYLASE in the last 168 hours. No results for input(s): AMMONIA in the last 168 hours.  CBC: Recent Labs  Lab 01/29/18 0651  WBC 14.0*  HGB 9.3*  HCT 30.6*  MCV 97.8  PLT 260    Cardiac Enzymes: No results for input(s): CKTOTAL, CKMB, CKMBINDEX, TROPONINI in the last 168 hours.  BNP (last 3 results) No results for input(s): BNP in the last 8760 hours.  ProBNP (last 3 results) No results for input(s): PROBNP in  the last 8760 hours.  Radiological Exams on Admission: No results found.  Assessment/Plan Principal Problem:   Acute on chronic respiratory failure with hypoxia (HCC) Active Problems:   Cardiac arrest (HCC)   Dysphagia   Cognitive dysfunction   Atrial fibrillation, chronic (HCC)   Lobar pneumonia (HCC)   C. difficile colitis   Encephalopathy chronic   1. Acute on chronic respiratory failure with hypoxia we will continue with weaning on T collar doing well so far patient is also tolerating the PMV we will continue with supportive care 2. Chronic atrial fibrillation rate is controlled we will continue to follow 3. Lobar pneumonia treated with antibiotics 4. Chronic encephalopathy we will continue supportive care right now is grossly unchanged 5. Cardiac arrest at baseline   I have personally seen and evaluated the patient, evaluated laboratory and imaging results, formulated the assessment and plan and placed orders. The Patient requires high complexity decision making for assessment and support.  Case was discussed on Rounds with the Respiratory Therapy Staff  Yevonne Pax, MD [facility] Pulmonary Critical Care Medicine Sleep Medicine

## 2018-01-31 DIAGNOSIS — G9349 Other encephalopathy: Secondary | ICD-10-CM | POA: Diagnosis not present

## 2018-01-31 DIAGNOSIS — J9621 Acute and chronic respiratory failure with hypoxia: Secondary | ICD-10-CM | POA: Diagnosis not present

## 2018-01-31 DIAGNOSIS — I482 Chronic atrial fibrillation: Secondary | ICD-10-CM | POA: Diagnosis not present

## 2018-01-31 DIAGNOSIS — I469 Cardiac arrest, cause unspecified: Secondary | ICD-10-CM | POA: Diagnosis not present

## 2018-01-31 LAB — BLOOD GAS, ARTERIAL
ACID-BASE EXCESS: 16.4 mmol/L — AB (ref 0.0–2.0)
BICARBONATE: 41.5 mmol/L — AB (ref 20.0–28.0)
FIO2: 80
O2 Saturation: 99.6 %
PCO2 ART: 54.7 mmHg — AB (ref 32.0–48.0)
PH ART: 7.485 — AB (ref 7.350–7.450)
PO2 ART: 250 mmHg — AB (ref 83.0–108.0)
Patient temperature: 96.2

## 2018-01-31 NOTE — Progress Notes (Signed)
Pulmonary Critical Care Medicine [facility] [place]   PULMONARY SERVICE  PROGRESS NOTE  Date of Service: 01/31/2018  Ariana Warren  [id number]  DOB: 1932/12/26   DOA: Jan 29, 2018  Referring Physician: Carron Curie, MD  HPI: Ariana Warren is a 84 y.o. female seen for follow up of Acute on Chronic Respiratory Failure.  Patient is on T collar she was attempted at capping and failed patient apparently developed desaturations along with bradycardia so was uncapped.  Right now she is on T collar and is on 30% FiO2.  Saturations are acceptable at this time.  Medications: Reviewed on Rounds  Physical Exam:  Vitals: Temperature 96.5 pulse 60 respiratory rate 24 blood pressure 123/80 saturations 100%  Ventilator Settings aerosolized T collar FiO2 30%  . General: Comfortable at this time . Eyes: Grossly normal lids, irises & conjunctiva . ENT: grossly tongue is normal . Neck: no obvious mass . Cardiovascular: S1-S2 normal no gallop or rub . Respiratory: No rhonchi . Abdomen: Soft nontender . Skin: no rash seen on limited exam . Musculoskeletal: not rigid . Psychiatric:unable to assess . Neurologic: no seizure no involuntary movements         Labs on Admission:  Basic Metabolic Panel: Recent Labs  Lab 01/26/18 0715 01/27/18 0550 01/28/18 0527 01/29/18 0651  NA 140 139 138 138  K 3.3* 3.5 3.6 3.5  CL 91* 89* 86* 88*  CO2 39* 40* 42* 38*  GLUCOSE 162* 178* 153* 191*  BUN CREATININE 0.52 0.55 0.49 0.62  CALCIUM 9.8 10.1 10.0 9.9    Liver Function Tests: No results for input(s): AST, ALT, ALKPHOS, BILITOT, PROT, ALBUMIN in the last 168 hours. No results for input(s): LIPASE, AMYLASE in the last 168 hours. No results for input(s): AMMONIA in the last 168 hours.  CBC: Recent Labs  Lab 01/29/18 0651  WBC 14.0*  HGB 9.3*  HCT 30.6*  MCV 97.8  PLT 260    Cardiac Enzymes: No results for input(s): CKTOTAL, CKMB, CKMBINDEX, TROPONINI in the  last 168 hours.  BNP (last 3 results) No results for input(s): BNP in the last 8760 hours.  ProBNP (last 3 results) No results for input(s): PROBNP in the last 8760 hours.  Radiological Exams on Admission: No results found.  Assessment/Plan Principal Problem:   Acute on chronic respiratory failure with hypoxia (HCC) Active Problems:   Cardiac arrest (HCC)   Dysphagia   Cognitive dysfunction   Atrial fibrillation, chronic (HCC)   Lobar pneumonia (HCC)   C. difficile colitis   Encephalopathy chronic   1. Acute on chronic respiratory failure with hypoxia we will continue with the T collar at this time.  Capping as noted above was attempted patient failed we will reassess continue with supportive care and follow along 2. Tonic atrial fibrillation rate is controlled we will continue to monitor 3. Cognitive dysfunction at baseline we will monitor 4. Cardiac arrest she is encephalopathic chronically we will continue to monitor 5. Chronic encephalopathy at baseline 6. Lobar pneumonia treated with antibiotics we will monitor   I have personally seen and evaluated the patient, evaluated laboratory and imaging results, formulated the assessment and plan and placed orders. The Patient requires high complexity decision making for assessment and support.  Case was discussed on Rounds with the Respiratory Therapy Staff  Yevonne Pax, MD [facility] Pulmonary Critical Care Medicine Sleep Medicine

## 2018-02-01 DIAGNOSIS — J9621 Acute and chronic respiratory failure with hypoxia: Secondary | ICD-10-CM | POA: Diagnosis not present

## 2018-02-01 DIAGNOSIS — G9349 Other encephalopathy: Secondary | ICD-10-CM | POA: Diagnosis not present

## 2018-02-01 DIAGNOSIS — I482 Chronic atrial fibrillation: Secondary | ICD-10-CM | POA: Diagnosis not present

## 2018-02-01 DIAGNOSIS — I469 Cardiac arrest, cause unspecified: Secondary | ICD-10-CM | POA: Diagnosis not present

## 2018-02-01 LAB — URINE CULTURE

## 2018-02-01 NOTE — Progress Notes (Signed)
Pulmonary Critical Care Medicine [facility] [place]   PULMONARY SERVICE  PROGRESS NOTE  Date of Service: 02/01/2018  Ariana Warren  ZOX:[id number]  DOB: 06/21/1933   DOA: 12/19/2017  Referring Physician: Carron Curie, MD  HPI: Ariana Warren is a 84 y.o. female seen for follow up of Acute on Chronic Respiratory Failure.  Patient is on T collar right now has been on 20% oxygen good saturations are noted.  Secretions are fair to moderate  Medications: Reviewed on Rounds  Physical Exam:  Vitals: Temperature 98.1 pulse 97 respiratory rate 28 blood pressure 99/55 saturations 99%  Ventilator Settings off the ventilator on T collar  . General: Comfortable at this time . Eyes: Grossly normal lids, irises & conjunctiva . ENT: grossly tongue is normal . Neck: no obvious mass . Cardiovascular: S1 S2 normal no gallop . Respiratory: Good air entry no rhonchi expansion is equal . Abdomen: soft . Skin: no rash seen on limited exam . Musculoskeletal: not rigid . Psychiatric:unable to assess . Neurologic: no seizure no involuntary movements         Labs on Admission:  Basic Metabolic Panel: Recent Labs  Lab 01/26/18 0715 01/27/18 0550 01/28/18 0527 01/29/18 0651  NA 140 139 138 138  K 3.3* 3.5 3.6 3.5  CL 91* 89* 86* 88*  CO2 39* 40* 42* 38*  GLUCOSE 162* 178* 153* 191*  BUN CREATININE 0.52 0.55 0.49 0.62  CALCIUM 9.8 10.1 10.0 9.9    Liver Function Tests: No results for input(s): AST, ALT, ALKPHOS, BILITOT, PROT, ALBUMIN in the last 168 hours. No results for input(s): LIPASE, AMYLASE in the last 168 hours. No results for input(s): AMMONIA in the last 168 hours.  CBC: Recent Labs  Lab 01/29/18 0651  WBC 14.0*  HGB 9.3*  HCT 30.6*  MCV 97.8  PLT 260    Cardiac Enzymes: No results for input(s): CKTOTAL, CKMB, CKMBINDEX, TROPONINI in the last 168 hours.  BNP (last 3 results) No results for input(s): BNP in the last 8760 hours.  ProBNP  (last 3 results) No results for input(s): PROBNP in the last 8760 hours.  Radiological Exams on Admission: No results found.  Assessment/Plan Principal Problem:   Acute on chronic respiratory failure with hypoxia (HCC) Active Problems:   Cardiac arrest [facility])   Cognitive dysfunction   Atrial fibrillation, chronic (HCC)   Lobar pneumonia (HCC)   Encephalopathy chronic   1. Acute on chronic respiratory failure with hypoxia right now is doing well on T collar trials will continue to advance. 2. Status post cardiac arrest at baseline we will continue supportive care 3. Cognitive dysfunction do not see much in the way of improvement we will continue supportive care 4. Chronic atrial fibrillation rate is controlled we will follow 5. Lobar pneumonia treated 6. Chronic encephalopathy stable   I have personally seen and evaluated the patient, evaluated laboratory and imaging results, formulated the assessment and plan and placed orders. The Patient requires high complexity decision making for assessment and support.  Case was discussed on Rounds with the Respiratory Therapy Staff  Yevonne Pax, MD [facility] Pulmonary Critical Care Medicine Sleep Medicine

## 2018-02-02 LAB — C DIFFICILE QUICK SCREEN W PCR REFLEX
C DIFFICILE (CDIFF) INTERP: DETECTED
C DIFFICILE (CDIFF) TOXIN: POSITIVE — AB
C Diff antigen: POSITIVE — AB

## 2018-02-03 DIAGNOSIS — I469 Cardiac arrest, cause unspecified: Secondary | ICD-10-CM | POA: Diagnosis not present

## 2018-02-03 DIAGNOSIS — I482 Chronic atrial fibrillation: Secondary | ICD-10-CM | POA: Diagnosis not present

## 2018-02-03 DIAGNOSIS — J9621 Acute and chronic respiratory failure with hypoxia: Secondary | ICD-10-CM | POA: Diagnosis not present

## 2018-02-03 DIAGNOSIS — F09 Unspecified mental disorder due to known physiological condition: Secondary | ICD-10-CM | POA: Diagnosis not present

## 2018-02-03 LAB — BASIC METABOLIC PANEL
Anion gap: 14 (ref 5–15)
BUN: 12 mg/dL (ref 6–20)
CALCIUM: 9 mg/dL (ref 8.9–10.3)
CO2: 37 mmol/L — ABNORMAL HIGH (ref 22–32)
Chloride: 92 mmol/L — ABNORMAL LOW (ref 101–111)
Creatinine, Ser: 0.47 mg/dL (ref 0.44–1.00)
GFR calc Af Amer: >60 mL/min (ref >60)
Glucose, Bld: 160 mg/dL — ABNORMAL HIGH (ref 65–99)
POTASSIUM: 2.5 mmol/L — AB (ref 3.5–5.1)
SODIUM: 143 mmol/L (ref 135–145)

## 2018-02-03 LAB — CBC
HCT: 23.7 % — ABNORMAL LOW (ref 36.0–46.0)
Hemoglobin: 7.1 g/dL — ABNORMAL LOW (ref 12.0–15.0)
MCH: 29.3 pg (ref 26.0–34.0)
MCHC: 30 g/dL (ref 30.0–36.0)
MCV: 97.9 fL (ref 78.0–100.0)
PLATELETS: 367 10*3/uL (ref 150–400)
RBC: 2.42 MIL/uL — AB (ref 3.87–5.11)
RDW: 14.8 % (ref 11.5–15.5)
WBC: 22.1 10*3/uL — ABNORMAL HIGH (ref 4.0–10.5)

## 2018-02-03 NOTE — Progress Notes (Signed)
Pulmonary Critical Care Medicine [facility] [place]   PULMONARY SERVICE  PROGRESS NOTE  Date of Service: 02/03/2018  Ariana Warren  ZOX:[id number]  DOB: 03-06-1933   DOA: Feb 09, 2018  Referring Physician: Carron Curie, MD  HPI: Ariana Warren is a 84 y.o. female seen for follow up of Acute on Chronic Respiratory Failure.  Currently patient is on T collar has been on 28% FiO2 has been tolerating the PMV.  Patient was attempted on capping however did not tolerate.  Secretions right now are fair to moderate  Medications: Reviewed on Rounds  Physical Exam:  Vitals: Temperature 97.9 pulse 91 respiratory rate 20 blood pressure 133/52  Ventilator Settings temperature 97.9 pulse 91 respiratory rate 20 blood pressure 133/52 saturations 94%  . General: Comfortable at this time . Eyes: Grossly normal lids, irises & conjunctiva . ENT: grossly tongue is normal . Neck: no obvious mass . Cardiovascular: S1 S2 normal no gallop . Respiratory: No rhonchi expansion is equal . Abdomen: soft . Skin: no rash seen on limited exam . Musculoskeletal: not rigid . Psychiatric:unable to assess . Neurologic: no seizure no involuntary movements         Labs on Admission:  Basic Metabolic Panel: Recent Labs  Lab 01/28/18 0527 01/29/18 0651 02/03/18 0722  NA 138 138 143  K 3.6 3.5 2.5*  CL 86* 88* 92*  CO2 42* 38* 37*  GLUCOSE 153* 191* 160*  BUN CREATININE 0.49 0.62 0.47  CALCIUM 10.0 9.9 9.0    Liver Function Tests: No results for input(s): AST, ALT, ALKPHOS, BILITOT, PROT, ALBUMIN in the last 168 hours. No results for input(s): LIPASE, AMYLASE in the last 168 hours. No results for input(s): AMMONIA in the last 168 hours.  CBC: Recent Labs  Lab 01/29/18 0651 02/03/18 0722  WBC 14.0* 22.1*  HGB 9.3* 7.1*  HCT 30.6* 23.7*  MCV 97.8 97.9  PLT 260 367    Cardiac Enzymes: No results for input(s): CKTOTAL, CKMB, CKMBINDEX, TROPONINI in the last 168  hours.  BNP (last 3 results) No results for input(s): BNP in the last 8760 hours.  ProBNP (last 3 results) No results for input(s): PROBNP in the last 8760 hours.  Radiological Exams on Admission: No results found.  Assessment/Plan Principal Problem:   Acute on chronic respiratory failure with hypoxia (HCC) Active Problems:   Cardiac arrest [facility])   Cognitive dysfunction   Atrial fibrillation, chronic (HCC)   Lobar pneumonia (HCC)   Encephalopathy chronic   1. Acute on chronic respiratory failure with hypoxia patient will be continued on T collar with the PMV.  Respiratory therapy is going to reassess this afternoon if patient is able to do any kind of capping again.  Continue with supportive care and follow along 2. Cognitive dysfunction she is grossly unchanged we will continue with supportive care 3. Chronic atrial fibrillation rate is adequately controlled we will continue to follow 4. Lobar pneumonia treated with antibiotics we will continue supportive care 5. Chronic encephalopathy unchanged 6. Status post cardiac arrest stable   I have personally seen and evaluated the patient, evaluated laboratory and imaging results, formulated the assessment and plan and placed orders. The Patient requires high complexity decision making for assessment and support.  Case was discussed on Rounds with the Respiratory Therapy Staff  Yevonne Pax, MD [facility] Pulmonary Critical Care Medicine Sleep Medicine

## 2018-02-04 DIAGNOSIS — I469 Cardiac arrest, cause unspecified: Secondary | ICD-10-CM | POA: Diagnosis not present

## 2018-02-04 DIAGNOSIS — F09 Unspecified mental disorder due to known physiological condition: Secondary | ICD-10-CM | POA: Diagnosis not present

## 2018-02-04 DIAGNOSIS — I482 Chronic atrial fibrillation: Secondary | ICD-10-CM | POA: Diagnosis not present

## 2018-02-04 DIAGNOSIS — J9621 Acute and chronic respiratory failure with hypoxia: Secondary | ICD-10-CM | POA: Diagnosis not present

## 2018-02-04 LAB — CULTURE, RESPIRATORY

## 2018-02-04 LAB — CULTURE, RESPIRATORY W GRAM STAIN

## 2018-02-04 LAB — POTASSIUM: Potassium: 3.8 mmol/L (ref 3.5–5.1)

## 2018-02-04 NOTE — Progress Notes (Signed)
Pulmonary Critical Care Medicine [facility] [place]   PULMONARY SERVICE  PROGRESS NOTE  Date of Service: 02/04/2018  Ariana Warren  [id number]  DOB: 1933/03/24   DOA: 12/17/2017  Referring Physician: Carron Curie, MD  HPI: Ariana Warren is a 84 y.o. female seen for follow up of Acute on Chronic Respiratory Failure.  She is on T collar weaning has been tolerating the PMV however has not been able to tolerate any Contents.  Patient has increased work of breathing noted in desaturations and has had bradycardic events also when she was attempted on capping  Medications: Reviewed on Rounds  Physical Exam:  Vitals: Temperature 98.0 pulse 86 respiratory rate 30 blood pressure 146/86 saturations 99%  Ventilator Settings off the ventilator on T collar  . General: Comfortable at this time . Eyes: Grossly normal lids, irises & conjunctiva . ENT: grossly tongue is normal . Neck: no obvious mass . Cardiovascular: S1 S2 normal no gallop . Respiratory: No rhonchi expansion equal . Abdomen: soft . Skin: no rash seen on limited exam . Musculoskeletal: not rigid . Psychiatric:unable to assess . Neurologic: no seizure no involuntary movements         Labs on Admission:  Basic Metabolic Panel: Recent Labs  Lab 01/29/18 0651 02/03/18 0722 02/04/18 0606  NA 138 143  --   K 3.5 2.5* 3.8  CL 88* 92*  --   CO2 38* 37*  --   GLUCOSE 191* 160*  --   BUN 18 12  --   CREATININE 0.62 0.47  --   CALCIUM 9.9 9.0  --     Liver Function Tests: No results for input(s): AST, ALT, ALKPHOS, BILITOT, PROT, ALBUMIN in the last 168 hours. No results for input(s): LIPASE, AMYLASE in the last 168 hours. No results for input(s): AMMONIA in the last 168 hours.  CBC: Recent Labs  Lab 01/29/18 0651 02/03/18 0722  WBC 14.0* 22.1*  HGB 9.3* 7.1*  HCT 30.6* 23.7*  MCV 97.8 97.9  PLT 260 367    Cardiac Enzymes: No results for input(s): CKTOTAL, CKMB, CKMBINDEX, TROPONINI in the  last 168 hours.  BNP (last 3 results) No results for input(s): BNP in the last 8760 hours.  ProBNP (last 3 results) No results for input(s): PROBNP in the last 8760 hours.  Radiological Exams on Admission: No results found.  Assessment/Plan Principal Problem:   Acute on chronic respiratory failure with hypoxia (HCC) Active Problems:   Cardiac arrest [facility])   Cognitive dysfunction   Atrial fibrillation, chronic (HCC)   Lobar pneumonia (HCC)   Encephalopathy chronic   1. Acute on chronic respiratory failure with hypoxia we will continue with T collar and failing capping attempts at this stage continue pulmonary toilet supportive care 2. Cognitive dysfunction at baseline we will continue to monitor 3. Chronic atrial fibrillation rate is controlled at this time 4. Lobar pneumonia treated with antibiotics we will continue to follow 5. Chronic encephalopathy at baseline 6. Status post cardiac arrest stable   I have personally seen and evaluated the patient, evaluated laboratory and imaging results, formulated the assessment and plan and placed orders. The Patient requires high complexity decision making for assessment and support.  Case was discussed on Rounds with the Respiratory Therapy Staff  Yevonne Pax, MD [facility] Pulmonary Critical Care Medicine Sleep Medicine

## 2018-02-05 DIAGNOSIS — I469 Cardiac arrest, cause unspecified: Secondary | ICD-10-CM | POA: Diagnosis not present

## 2018-02-05 DIAGNOSIS — F09 Unspecified mental disorder due to known physiological condition: Secondary | ICD-10-CM | POA: Diagnosis not present

## 2018-02-05 DIAGNOSIS — J181 Lobar pneumonia, unspecified organism: Secondary | ICD-10-CM | POA: Diagnosis not present

## 2018-02-05 DIAGNOSIS — I482 Chronic atrial fibrillation: Secondary | ICD-10-CM | POA: Diagnosis not present

## 2018-02-05 DIAGNOSIS — G9349 Other encephalopathy: Secondary | ICD-10-CM | POA: Diagnosis not present

## 2018-02-05 DIAGNOSIS — J9621 Acute and chronic respiratory failure with hypoxia: Secondary | ICD-10-CM | POA: Diagnosis not present

## 2018-02-05 LAB — VANCOMYCIN, TROUGH: Vancomycin Tr: 12 ug/mL — ABNORMAL LOW (ref 15–20)

## 2018-02-05 NOTE — Progress Notes (Signed)
Pulmonary Critical Care Medicine [facility] [place]   PULMONARY SERVICE  PROGRESS NOTE  Date of Service: 02/05/2018  Ariana Warren  GNF:[id number]  DOB: 05/01/1933   DOA: 01/30/18  Referring Physician: Carron Curie, MD  HPI: Ariana Warren is a 84 y.o. female seen for follow up of Acute on Chronic Respiratory Failure.  She remains on T collar right now is on 20% oxygen has been tolerating the PMV fairly well also  Medications: Reviewed on Rounds  Physical Exam:  Vitals: Temperature 97.8 pulse 66 respiratory 27 blood pressure 142/57 saturation 97%  Ventilator Settings on T collar FiO2 28%  . General: Comfortable at this time . Eyes: Grossly normal lids, irises & conjunctiva . ENT: grossly tongue is normal . Neck: no obvious mass . Cardiovascular: S1 S2 normal no gallop . Respiratory: No rhonchi rales . Abdomen: soft . Skin: no rash seen on limited exam . Musculoskeletal: not rigid . Psychiatric:unable to assess . Neurologic: no seizure no involuntary movements         Labs on Admission:  Basic Metabolic Panel: Recent Labs  Lab 02/03/18 0722 02/04/18 0606  NA 143  --   K 2.5* 3.8  CL 92*  --   CO2 37*  --   GLUCOSE 160*  --   BUN 12  --   CREATININE 0.47  --   CALCIUM 9.0  --     Liver Function Tests: No results for input(s): AST, ALT, ALKPHOS, BILITOT, PROT, ALBUMIN in the last 168 hours. No results for input(s): LIPASE, AMYLASE in the last 168 hours. No results for input(s): AMMONIA in the last 168 hours.  CBC: Recent Labs  Lab 02/03/18 0722  WBC 22.1*  HGB 7.1*  HCT 23.7*  MCV 97.9  PLT 367    Cardiac Enzymes: No results for input(s): CKTOTAL, CKMB, CKMBINDEX, TROPONINI in the last 168 hours.  BNP (last 3 results) No results for input(s): BNP in the last 8760 hours.  ProBNP (last 3 results) No results for input(s): PROBNP in the last 8760 hours.  Radiological Exams on Admission: No results  found.  Assessment/Plan Principal Problem:   Acute on chronic respiratory failure with hypoxia (HCC) Active Problems:   Cardiac arrest [facility])   Cognitive dysfunction   Atrial fibrillation, chronic (HCC)   Lobar pneumonia (HCC)   Encephalopathy chronic   1. Acute on chronic respiratory failure with hypoxia doing fairly well we will continue with T collar as ordered also tolerating the PMV 2. Cognitive dysfunction stable at baseline we will continue to monitor 3. Chronic atrial fibrillation rate is controlled we will follow along 4. Lobar pneumonia treated with antibiotics stable 5. Chronic encephalopathy she is grossly unchanged. 6. Status post cardiac arrest monitor   I have personally seen and evaluated the patient, evaluated laboratory and imaging results, formulated the assessment and plan and placed orders. The Patient requires high complexity decision making for assessment and support.  Case was discussed on Rounds with the Respiratory Therapy Staff  Yevonne Pax, MD [facility] Pulmonary Critical Care Medicine Sleep Medicine

## 2018-02-06 DIAGNOSIS — J9621 Acute and chronic respiratory failure with hypoxia: Secondary | ICD-10-CM | POA: Diagnosis not present

## 2018-02-06 DIAGNOSIS — F09 Unspecified mental disorder due to known physiological condition: Secondary | ICD-10-CM | POA: Diagnosis not present

## 2018-02-06 DIAGNOSIS — I482 Chronic atrial fibrillation: Secondary | ICD-10-CM | POA: Diagnosis not present

## 2018-02-06 DIAGNOSIS — I469 Cardiac arrest, cause unspecified: Secondary | ICD-10-CM | POA: Diagnosis not present

## 2018-02-06 NOTE — Progress Notes (Signed)
Pulmonary Critical Care Medicine [facility] [place]   PULMONARY SERVICE  PROGRESS NOTE  Date of Service: 02/06/2018  Ariana Warren  ZOX:[id number]  DOB: 1932-12-12   DOA: 12/16/2017  Referring Physician: Carron Curie, MD  HPI: Ariana Warren is a 84 y.o. female seen for follow up of Acute on Chronic Respiratory Failure.  Currently patient is on T collar trials has been on 35% oxygen.  No distress is noted  Medications: Reviewed on Rounds  Physical Exam:  Vitals: Temperature 98.7 pulse 81 respiratory rate 22 blood pressure 146/78 saturations 100%  Ventilator Settings currently is off the ventilator on aerosolized T collar 35% FiO2  . General: Comfortable at this time . Eyes: Grossly normal lids, irises & conjunctiva . ENT: grossly tongue is normal . Neck: no obvious mass . Cardiovascular: S1 S2 normal no gallop . Respiratory: No rhonchi are noted expansion is equal . Abdomen: soft . Skin: no rash seen on limited exam . Musculoskeletal: not rigid . Psychiatric:unable to assess . Neurologic: no seizure no involuntary movements         Labs on Admission:  Basic Metabolic Panel: Recent Labs  Lab 02/03/18 0722 02/04/18 0606  NA 143  --   K 2.5* 3.8  CL 92*  --   CO2 37*  --   GLUCOSE 160*  --   BUN 12  --   CREATININE 0.47  --   CALCIUM 9.0  --     Liver Function Tests: No results for input(s): AST, ALT, ALKPHOS, BILITOT, PROT, ALBUMIN in the last 168 hours. No results for input(s): LIPASE, AMYLASE in the last 168 hours. No results for input(s): AMMONIA in the last 168 hours.  CBC: Recent Labs  Lab 02/03/18 0722  WBC 22.1*  HGB 7.1*  HCT 23.7*  MCV 97.9  PLT 367    Cardiac Enzymes: No results for input(s): CKTOTAL, CKMB, CKMBINDEX, TROPONINI in the last 168 hours.  BNP (last 3 results) No results for input(s): BNP in the last 8760 hours.  ProBNP (last 3 results) No results for input(s): PROBNP in the last 8760 hours.  Radiological  Exams on Admission: No results found.  Assessment/Plan Principal Problem:   Acute on chronic respiratory failure with hypoxia (HCC) Active Problems:   Cardiac arrest [facility])   Cognitive dysfunction   Atrial fibrillation, chronic (HCC)   Lobar pneumonia (HCC)   Encephalopathy chronic   1. Acute on chronic respiratory failure with hypoxia continue with T collar patient is on 35% FiO2 secretions are fair to moderate we will continue pulmonary toilet and follow along. 2. Cognitive dysfunction she is at baseline we will continue to monitor 3. Chronic atrial fibrillation rate is controlled at this time will monitor. 4. Lobar pneumonia treated follow x-rays as necessary 5. Chronic encephalopathy at baseline   I have personally seen and evaluated the patient, evaluated laboratory and imaging results, formulated the assessment and plan and placed orders. The Patient requires high complexity decision making for assessment and support.  Case was discussed on Rounds with the Respiratory Therapy Staff  Yevonne Pax, MD [facility] Pulmonary Critical Care Medicine Sleep Medicine

## 2018-02-07 DIAGNOSIS — I482 Chronic atrial fibrillation: Secondary | ICD-10-CM | POA: Diagnosis not present

## 2018-02-07 DIAGNOSIS — F09 Unspecified mental disorder due to known physiological condition: Secondary | ICD-10-CM | POA: Diagnosis not present

## 2018-02-07 DIAGNOSIS — J9621 Acute and chronic respiratory failure with hypoxia: Secondary | ICD-10-CM | POA: Diagnosis not present

## 2018-02-07 DIAGNOSIS — I469 Cardiac arrest, cause unspecified: Secondary | ICD-10-CM | POA: Diagnosis not present

## 2018-02-07 MED FILL — Medication: Qty: 1 | Status: AC

## 2018-02-07 NOTE — Progress Notes (Signed)
Pulmonary Critical Care Medicine [facility] [place]   PULMONARY SERVICE  PROGRESS NOTE  Date of Service: 02/07/2018  Ariana Warren  ZOX:[id number]  DOB: 06-02-1933   DOA: 12/25/2017  Referring Physician: Carron Curie, MD  HPI: Ariana Warren is a 84 y.o. female seen for follow up of Acute on Chronic Respiratory Failure.  She is on T collar right now on 20% oxygen.  Good saturations are noted.  Reportedly on rounds I was told that she had some episodes of bradycardia right now is in sinus rhythm but she did have heart rate dropped down into the teens apparently  Medications: Reviewed on Rounds  Physical Exam:  Vitals: Temperature 97.0 pulse 63 respiratory rate 18 blood pressure 112/80 saturations 96%  Ventilator Settings off the ventilator on T collar FiO2 20%  . General: Comfortable at this time . Eyes: Grossly normal lids, irises & conjunctiva . ENT: grossly tongue is normal . Neck: no obvious mass . Cardiovascular: S1 S2 normal no gallop . Respiratory: No rhonchi expansion is equal . Abdomen: soft . Skin: no rash seen on limited exam . Musculoskeletal: not rigid . Psychiatric:unable to assess . Neurologic: no seizure no involuntary movements         Labs on Admission:  Basic Metabolic Panel: Recent Labs  Lab 02/03/18 0722 02/04/18 0606  NA 143  --   K 2.5* 3.8  CL 92*  --   CO2 37*  --   GLUCOSE 160*  --   BUN 12  --   CREATININE 0.47  --   CALCIUM 9.0  --     Liver Function Tests: No results for input(s): AST, ALT, ALKPHOS, BILITOT, PROT, ALBUMIN in the last 168 hours. No results for input(s): LIPASE, AMYLASE in the last 168 hours. No results for input(s): AMMONIA in the last 168 hours.  CBC: Recent Labs  Lab 02/03/18 0722  WBC 22.1*  HGB 7.1*  HCT 23.7*  MCV 97.9  PLT 367    Cardiac Enzymes: No results for input(s): CKTOTAL, CKMB, CKMBINDEX, TROPONINI in the last 168 hours.  BNP (last 3 results) No results for input(s): BNP in  the last 8760 hours.  ProBNP (last 3 results) No results for input(s): PROBNP in the last 8760 hours.  Radiological Exams on Admission: No results found.  Assessment/Plan Principal Problem:   Acute on chronic respiratory failure with hypoxia (HCC) Active Problems:   Cardiac arrest [facility])   Cognitive dysfunction   Atrial fibrillation, chronic (HCC)   Lobar pneumonia (HCC)   Encephalopathy chronic   1. Acute on chronic respiratory failure with hypoxia would continue with T collar as ordered right now monitor the saturations very closely.  Is not clear whether the bradycardia occurred as a result of hypoxia because there was no documented desaturations noted. 2. Cognitive dysfunction patient is at baseline 3. Chronic atrial fibrillation with bradycardic episode primary care team is aware they are monitoring. 4. Lobar pneumonia treated with antibiotics 5. Encephalopathy chronic improved we will continue with present management. 6. Status post cardiac arrest we will continue to monitor closely   I have personally seen and evaluated the patient, evaluated laboratory and imaging results, formulated the assessment and plan and placed orders. The Patient requires high complexity decision making for assessment and support.  Case was discussed on Rounds with the Respiratory Therapy Staff  Yevonne Pax, MD [facility] Pulmonary Critical Care Medicine Sleep Medicine

## 2018-02-08 LAB — COMPREHENSIVE METABOLIC PANEL
ALT: 11 U/L — ABNORMAL LOW (ref 14–54)
ANION GAP: 5 (ref 5–15)
AST: 13 U/L — ABNORMAL LOW (ref 15–41)
Albumin: 1.7 g/dL — ABNORMAL LOW (ref 3.5–5.0)
Alkaline Phosphatase: 62 U/L (ref 38–126)
BILIRUBIN TOTAL: 0.4 mg/dL (ref 0.3–1.2)
BUN: 10 mg/dL (ref 6–20)
CALCIUM: 9.4 mg/dL (ref 8.9–10.3)
CO2: 40 mmol/L — ABNORMAL HIGH (ref 22–32)
Chloride: 102 mmol/L (ref 101–111)
Creatinine, Ser: 0.5 mg/dL (ref 0.44–1.00)
GFR calc non Af Amer: >60 mL/min (ref >60)
Glucose, Bld: 173 mg/dL — ABNORMAL HIGH (ref 65–99)
Potassium: 4.6 mmol/L (ref 3.5–5.1)
SODIUM: 147 mmol/L — AB (ref 135–145)
TOTAL PROTEIN: 6 g/dL — AB (ref 6.5–8.1)

## 2018-02-09 MED FILL — Medication: Qty: 1 | Status: AC

## 2018-02-15 NOTE — Code Documentation (Signed)
CODE BLUE NOTE  Patient Name: Ariana Warren   MRN: [id number]   Date of Birth/ Sex: 17-Jun-1933 , female      Admission Date: 2018/02/07  Attending Provider: Carron Curie, MD  Primary Diagnosis: cardia arrest    Indication: Pt was in her usual state of health until this PM, when she was noted to be in PEA by her floor nurse. Code blue was subsequently called. At the time of arrival on scene, ACLS protocol was underway.   Technical Description:  - CPR performance duration:  .  - Was defibrillation or cardioversion used? No , she was PEA  - Was external pacer placed? No  - Was patient intubated pre/post CPR? No, on trach    Medications Administered: Y = Yes; Blank = No Amiodarone    Atropine    Calcium    Epinephrine  y  Lidocaine    Magnesium    Norepinephrine    Phenylephrine    Sodium bicarbonate  y  Vasopressin    Other     Post CPR evaluation:  - Final Status - Was patient successfully resuscitated ? No   Miscellaneous Information:  - Time of death:  3:54 AM  - Primary team notified?  Yes  - Family Notified? Yes, niece was called while she was en-route to hospital by myself and when told that we had been attempting CPR for she asked [place] to stop.  She confirmed that prolonged CPR is something the patient had told family she did not want to go through again and that this was something the entire family was aware of.  I returned to the code team w/ Dr. Mikey Bussing for a final pulse check and time of death was pronounced      [place], RN Rapid Response Team, present Dr. Mikey Bussing, Senior Code Pager, present Marthenia Rolling, DO  , Junior Code Pager 02/08/2018, 4:05 AM

## 2018-02-15 NOTE — Progress Notes (Signed)
   02/08/2018 0400  Clinical Encounter Type  Visited With Family  Visit Type Death  Referral From Nurse  Spiritual Encounters  Spiritual Needs Emotional;Grief support  CH paged to offer spiritual, emotional and grief support for niece of patient following death.

## 2018-02-15 DEATH — deceased

## 2018-09-09 IMAGING — DX DG ABD PORTABLE 1V
1 series · 1 of 1 positions shown · IV contrast (isovue)
Comparison: None.

CLINICAL DATA: Gastrostomy tube position check. 40 mL of Isovue
administered.

EXAM:
PORTABLE ABDOMEN - 1 VIEW

[abdomen kub]
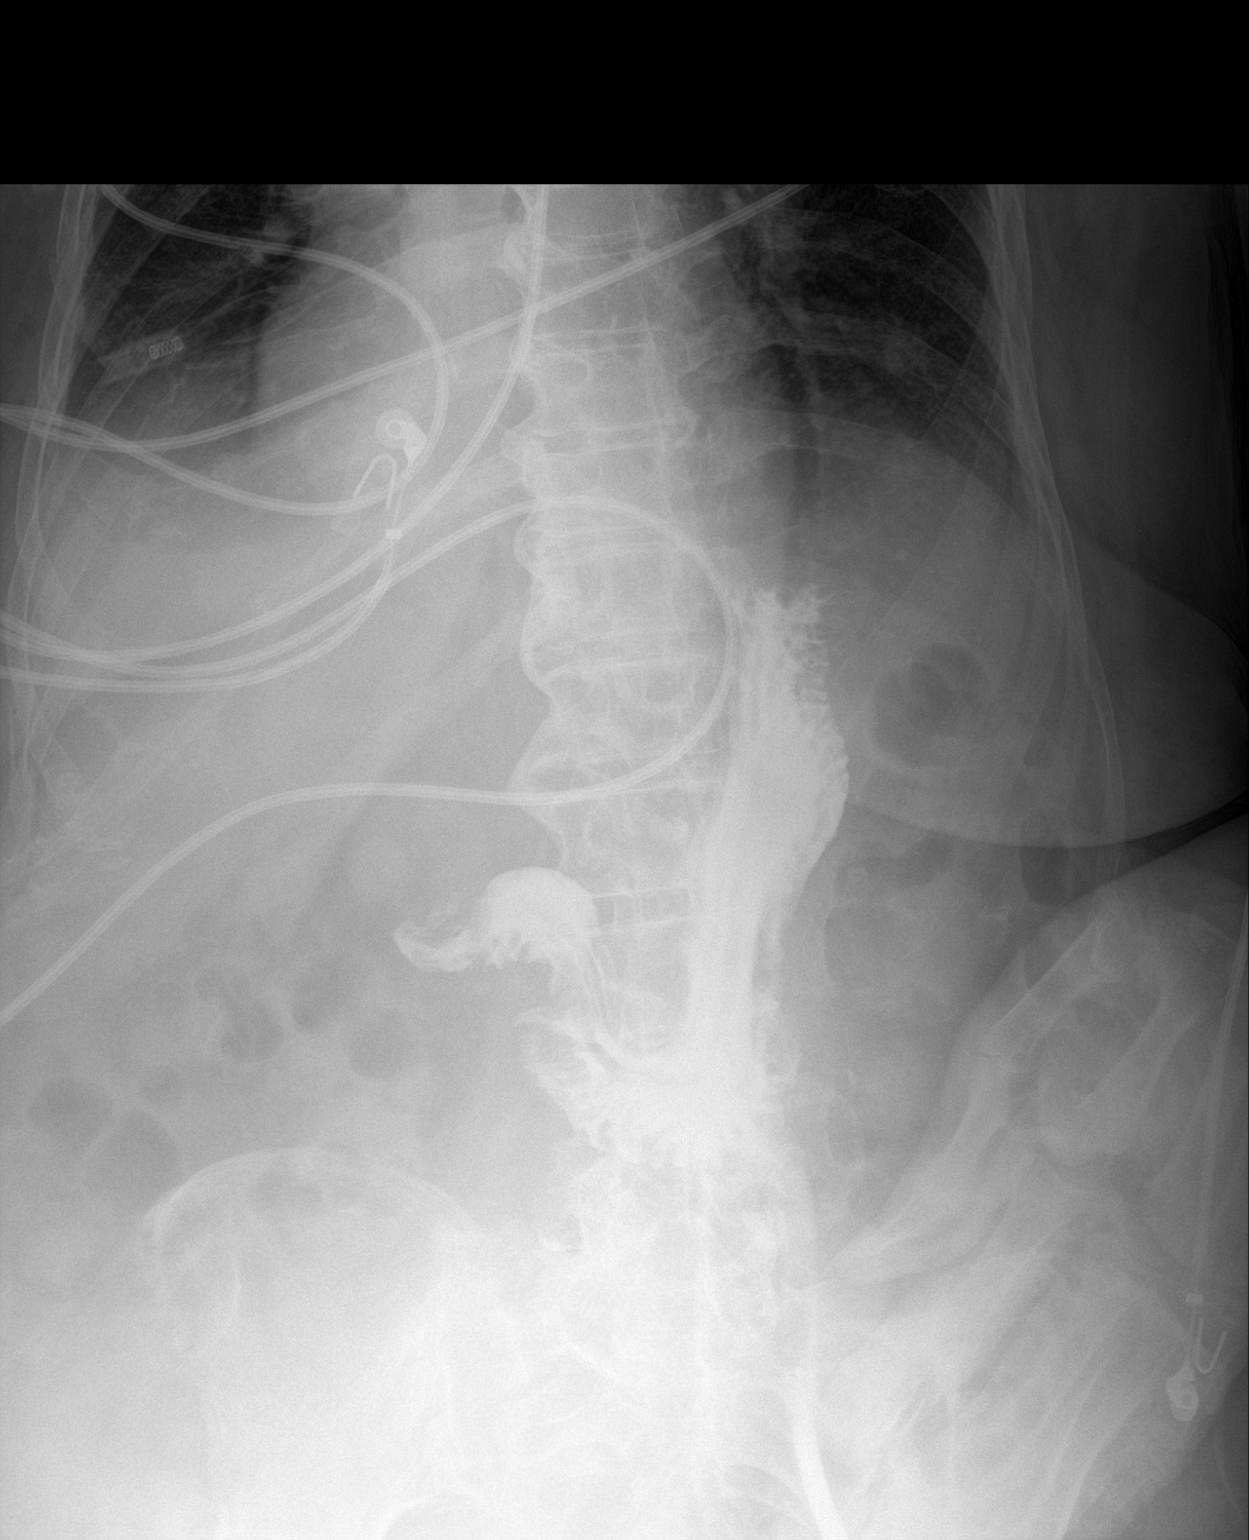

[1 of 1 positions shown; findings below may reference images not displayed]

FINDINGS: There is contrast material within the stomach. No dilated small
bowel.
IMPRESSION: Contrast injection confirming gastrostomy tube tip within the
gastric lumen.

## 2018-09-15 IMAGING — CR DG CHEST 1V PORT
1 series · 1 of 1 positions shown · non-contrast
Comparison: None.

CLINICAL DATA: Shortness of breath

EXAM:
PORTABLE CHEST 1 VIEW

[AP]
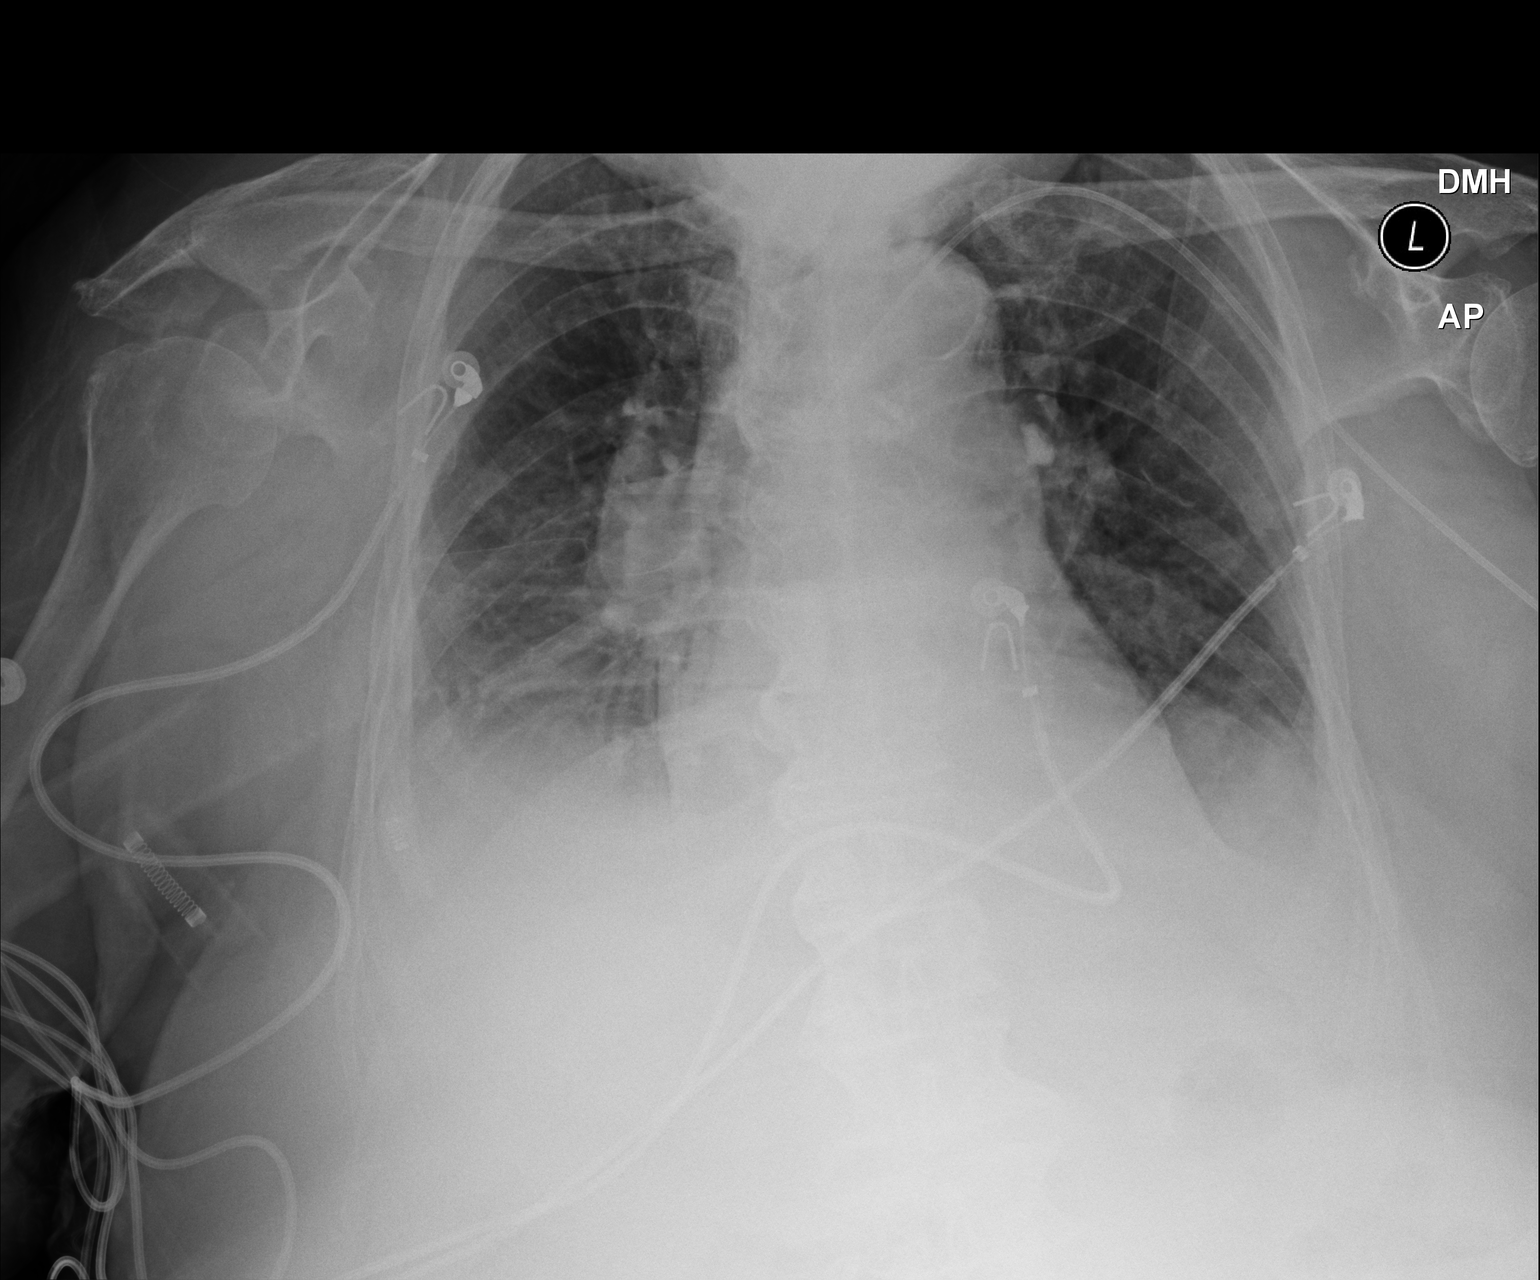

[1 of 1 positions shown; findings below may reference images not displayed]

FINDINGS: A left-sided PICC line terminates in the SVC just below the
brachiocephalic confluence. No pneumothorax identified. However, the
apices are partially obscured by the patient's overlapping chin.
Haziness over the bases is most consistent with effusions with
underlying opacities, possibly atelectasis. The right hilum is
mildly prominent, possibly due to technique. The cardiomediastinal
silhouette is otherwise normal.
IMPRESSION: 1. Mild prominence of the right hilum is favored to be technical in
nature. Recommend a PA and lateral chest x-ray before discharge.
2. Small layering effusions with underlying opacities.
3. Left PICC line as above.
